# Patient Record
Sex: Female | Born: 1975 | Hispanic: Yes | Marital: Single | Smoking: Never smoker
Health system: Southern US, Community
[De-identification: ages and names within clinical notes are randomized; demographics above are authoritative.]

## PROBLEM LIST (undated history)

## (undated) ENCOUNTER — Inpatient Hospital Stay (HOSPITAL_COMMUNITY): Payer: Self-pay

## (undated) DIAGNOSIS — Z789 Other specified health status: Secondary | ICD-10-CM

## (undated) DIAGNOSIS — I83893 Varicose veins of bilateral lower extremities with other complications: Secondary | ICD-10-CM

## (undated) HISTORY — PX: NO PAST SURGERIES: SHX2092

---

## 2014-10-25 NOTE — L&D Delivery Note (Signed)
Delivery Note At 9:16 AM a viable female was delivered via  (Presentation: ;  ).  APGAR: , ; weight  .   Placenta status: , .  Cord:  with the following complications: .  Cord pH: not done  Anesthesia: Epidural  Episiotomy:   Lacerations:   Suture Repair: 2.0 Est. Blood Loss (mL):    Mom to postpartum.  Baby to Couplet care / Skin to Skin.  MARSHALL,BERNARD A 06/17/2015, 9:31 AM

## 2014-12-03 LAB — OB RESULTS CONSOLE GC/CHLAMYDIA
Chlamydia: NEGATIVE
Gonorrhea: NEGATIVE

## 2014-12-03 LAB — OB RESULTS CONSOLE RPR: RPR: NONREACTIVE

## 2014-12-03 LAB — CYTOLOGY - PAP: Pap: NEGATIVE

## 2014-12-03 LAB — OB RESULTS CONSOLE HEPATITIS B SURFACE ANTIGEN: HEP B S AG: NEGATIVE

## 2014-12-03 LAB — OB RESULTS CONSOLE GBS: GBS: NEGATIVE

## 2014-12-03 LAB — OB RESULTS CONSOLE RUBELLA ANTIBODY, IGM: RUBELLA: IMMUNE

## 2014-12-03 LAB — OB RESULTS CONSOLE HIV ANTIBODY (ROUTINE TESTING): HIV: NONREACTIVE

## 2014-12-05 ENCOUNTER — Encounter (HOSPITAL_COMMUNITY): Payer: Self-pay

## 2014-12-05 ENCOUNTER — Ambulatory Visit (HOSPITAL_COMMUNITY)
Admission: RE | Admit: 2014-12-05 | Discharge: 2014-12-05 | Disposition: A | Discharge status: Home / Self Care | Payer: Self-pay | Source: Ambulatory Visit | Attending: Obstetrics | Admitting: Obstetrics

## 2014-12-05 ENCOUNTER — Other Ambulatory Visit (HOSPITAL_COMMUNITY): Payer: Self-pay | Admitting: Obstetrics

## 2014-12-05 DIAGNOSIS — Z111 Encounter for screening for respiratory tuberculosis: Secondary | ICD-10-CM

## 2014-12-05 DIAGNOSIS — O9989 Other specified diseases and conditions complicating pregnancy, childbirth and the puerperium: Secondary | ICD-10-CM | POA: Insufficient documentation

## 2014-12-05 DIAGNOSIS — Z3A11 11 weeks gestation of pregnancy: Secondary | ICD-10-CM | POA: Insufficient documentation

## 2014-12-06 ENCOUNTER — Other Ambulatory Visit (HOSPITAL_COMMUNITY): Payer: Self-pay | Admitting: Obstetrics

## 2014-12-06 DIAGNOSIS — O26842 Uterine size-date discrepancy, second trimester: Secondary | ICD-10-CM

## 2014-12-06 DIAGNOSIS — O09522 Supervision of elderly multigravida, second trimester: Secondary | ICD-10-CM

## 2014-12-06 DIAGNOSIS — Z3689 Encounter for other specified antenatal screening: Secondary | ICD-10-CM

## 2014-12-10 ENCOUNTER — Ambulatory Visit (HOSPITAL_COMMUNITY)
Admission: RE | Admit: 2014-12-10 | Discharge: 2014-12-10 | Disposition: A | Discharge status: Home / Self Care | Payer: Self-pay | Source: Ambulatory Visit | Attending: Obstetrics | Admitting: Obstetrics

## 2014-12-10 ENCOUNTER — Encounter (HOSPITAL_COMMUNITY): Payer: Self-pay

## 2014-12-10 ENCOUNTER — Other Ambulatory Visit (HOSPITAL_COMMUNITY): Payer: Self-pay | Admitting: Obstetrics

## 2014-12-10 DIAGNOSIS — O26842 Uterine size-date discrepancy, second trimester: Secondary | ICD-10-CM

## 2014-12-10 DIAGNOSIS — O09522 Supervision of elderly multigravida, second trimester: Secondary | ICD-10-CM

## 2014-12-10 DIAGNOSIS — O09529 Supervision of elderly multigravida, unspecified trimester: Secondary | ICD-10-CM | POA: Insufficient documentation

## 2014-12-10 DIAGNOSIS — Z315 Encounter for genetic counseling: Secondary | ICD-10-CM | POA: Insufficient documentation

## 2014-12-10 DIAGNOSIS — O09521 Supervision of elderly multigravida, first trimester: Secondary | ICD-10-CM | POA: Insufficient documentation

## 2014-12-10 DIAGNOSIS — O26849 Uterine size-date discrepancy, unspecified trimester: Secondary | ICD-10-CM | POA: Insufficient documentation

## 2014-12-10 DIAGNOSIS — Z3689 Encounter for other specified antenatal screening: Secondary | ICD-10-CM

## 2014-12-10 DIAGNOSIS — Z3A12 12 weeks gestation of pregnancy: Secondary | ICD-10-CM | POA: Insufficient documentation

## 2014-12-10 NOTE — Progress Notes (Signed)
Genetic Counseling  High-Risk Gestation Note  Appointment Date:  12/10/2014 Referred By: Kathreen Cosier, MD Date of Birth:  Mar 19, 1976   Pregnancy History: [id number] Estimated Date of Delivery: 06/20/15 Estimated Gestational Age: [redacted]w[redacted]d Attending: Particia Nearing, MD  [name] was seen for genetic counseling because of a maternal age of 39 y.o.. She declined the use of medical interpreter. The patient's daughter, Roberta Marshall, assisted with Spanish/English interpretation.      She was counseled regarding maternal age and the association with risk for chromosome conditions due to nondisjunction with aging of the ova.   We reviewed chromosomes, nondisjunction, and the associated 1 in 24 risk for fetal aneuploidy related to a maternal age of 39 y.o. at [redacted]w[redacted]d gestation.  She was counseled that the risk for aneuploidy decreases as gestational age increases, accounting for those pregnancies which spontaneously abort.  We specifically discussed Down syndrome (trisomy 34), trisomies 36 and 24, and sex chromosome aneuploidies (47,XXX and 47,XXY) including the common features and prognoses of each.   We reviewed available screening options including First Screen, Quad screen, noninvasive prenatal screening (NIPS)/cell free DNA (cfDNA) testing, and detailed ultrasound.  She was counseled that screening tests are used to modify a patient's a priori risk for aneuploidy, typically based on age. This estimate provides a pregnancy specific risk assessment. We reviewed the benefits and limitations of each option. Specifically, we discussed the conditions for which each test screens, the detection rates, and false positive rates of each. She was also counseled regarding diagnostic testing via CVS and amniocentesis. We reviewed the approximate 1 in 100 risk for complications for CVS and the approximate 1 in 300-500 risk for complications for amniocentesis, including spontaneous pregnancy loss. After consideration  of all the options, she elected to proceed with ultrasound only at this time and declined all additional screening and testing options today. She stated that she needed additional time to read about and think about the available screening and testing options. She declined first trimester screening at this time.     Nuchal translucency ultrasound was performed today.  The report will be documented separately. The patient would like to return for a detailed ultrasound at ~18+ weeks gestation.  This appointment was scheduled for 01/21/15. She understands that screening tests cannot rule out all birth defects or genetic syndromes.  The patient was advised of this limitation and states she still does not want additional testing at this time.   We briefly reviewed the family histories, and the patient reported no known birth defects, intellectual disability, recurrent pregnancy loss, or known genetic conditions. Detailed family history intake was deferred due to time constraints and given that the patient was not initially scheduled for genetic counseling. Without further information regarding the provided family history, an accurate genetic risk cannot be calculated. Further genetic counseling is warranted if more information is obtained.  [name] denied exposure to environmental toxins or chemical agents. She denied the use of tobacco or street drugs. She reported drinking alcohol on New Year's.  Prenatal alcohol exposure can increase the risk for growth delays, small head size, heart defects, eye and facial differences, as well as behavior problems and learning disabilities. The risk of these to occur tends to increase with the amount of alcohol consumed. However, because there is no identified safe amount of alcohol in pregnancy, it is recommended to completely avoid alcohol in pregnancy. Given the reported amount of exposure, risk for associated effects are likely low in the current  pregnancy. She  denied significant viral illnesses during the course of her pregnancy. Her medical and surgical histories were noncontributory.   I counseled [name] regarding the above risks and available options.  The approximate face-to-face time with the genetic counselor was 40 minutes.  Roberta PlowmanKaren Hovanes Hymas, MS,  Certified Genetic Counselor 12/10/2014

## 2015-01-21 ENCOUNTER — Ambulatory Visit (HOSPITAL_COMMUNITY)
Admission: RE | Admit: 2015-01-21 | Discharge: 2015-01-21 | Disposition: A | Discharge status: Home / Self Care | Payer: Self-pay | Source: Ambulatory Visit | Attending: Obstetrics | Admitting: Obstetrics

## 2015-01-21 ENCOUNTER — Encounter (HOSPITAL_COMMUNITY): Payer: Self-pay

## 2015-01-21 DIAGNOSIS — O09529 Supervision of elderly multigravida, unspecified trimester: Secondary | ICD-10-CM | POA: Insufficient documentation

## 2015-01-21 DIAGNOSIS — Z3A18 18 weeks gestation of pregnancy: Secondary | ICD-10-CM | POA: Insufficient documentation

## 2015-01-21 DIAGNOSIS — Z3689 Encounter for other specified antenatal screening: Secondary | ICD-10-CM

## 2015-01-21 DIAGNOSIS — O26842 Uterine size-date discrepancy, second trimester: Secondary | ICD-10-CM

## 2015-01-21 DIAGNOSIS — O09522 Supervision of elderly multigravida, second trimester: Secondary | ICD-10-CM | POA: Insufficient documentation

## 2015-01-21 DIAGNOSIS — Z302 Encounter for sterilization: Secondary | ICD-10-CM | POA: Insufficient documentation

## 2015-01-21 DIAGNOSIS — Z36 Encounter for antenatal screening of mother: Secondary | ICD-10-CM | POA: Insufficient documentation

## 2015-05-20 LAB — OB RESULTS CONSOLE GBS: STREP GROUP B AG: NEGATIVE

## 2015-06-17 ENCOUNTER — Inpatient Hospital Stay (HOSPITAL_COMMUNITY)
Admission: AD | Admit: 2015-06-17 | Discharge: 2015-06-18 | DRG: 775 | Disposition: A | Discharge status: Home / Self Care | Payer: Medicaid Other | Source: Ambulatory Visit | Attending: Obstetrics | Admitting: Obstetrics

## 2015-06-17 ENCOUNTER — Inpatient Hospital Stay (HOSPITAL_COMMUNITY): Payer: Medicaid Other | Admitting: Anesthesiology

## 2015-06-17 ENCOUNTER — Encounter (HOSPITAL_COMMUNITY): Payer: Self-pay

## 2015-06-17 DIAGNOSIS — O26893 Other specified pregnancy related conditions, third trimester: Principal | ICD-10-CM | POA: Diagnosis present

## 2015-06-17 DIAGNOSIS — Z3A39 39 weeks gestation of pregnancy: Secondary | ICD-10-CM | POA: Diagnosis present

## 2015-06-17 DIAGNOSIS — O09523 Supervision of elderly multigravida, third trimester: Secondary | ICD-10-CM

## 2015-06-17 LAB — CBC
HCT: 36.9 % (ref 36.0–46.0)
Hemoglobin: 12.8 g/dL (ref 12.0–15.0)
MCH: 29.8 pg (ref 26.0–34.0)
MCHC: 34.7 g/dL (ref 30.0–36.0)
MCV: 86 fL (ref 78.0–100.0)
PLATELETS: 279 10*3/uL (ref 150–400)
RBC: 4.29 MIL/uL (ref 3.87–5.11)
RDW: 14.6 % (ref 11.5–15.5)
WBC: 8.5 10*3/uL (ref 4.0–10.5)

## 2015-06-17 LAB — TYPE AND SCREEN
ABO/RH(D): O POS
ANTIBODY SCREEN: NEGATIVE

## 2015-06-17 LAB — RPR: RPR Ser Ql: NONREACTIVE

## 2015-06-17 LAB — ABO/RH: ABO/RH(D): O POS

## 2015-06-17 MED ORDER — OXYTOCIN 40 UNITS IN LACTATED RINGERS INFUSION - SIMPLE MED
62.5000 mL/h | INTRAVENOUS | Status: DC
  Filled 2015-06-17: qty 1000

## 2015-06-17 MED ORDER — FENTANYL 2.5 MCG/ML BUPIVACAINE 1/10 % EPIDURAL INFUSION (WH - ANES)
14.0000 mL/h | INTRAMUSCULAR | Status: DC | PRN
  Administered 2015-06-17 (×2): 14 mL/h via EPIDURAL
  Filled 2015-06-17: qty 125

## 2015-06-17 MED ORDER — ZOLPIDEM TARTRATE 5 MG PO TABS: 5.0000 mg | ORAL_TABLET | Freq: Every evening | ORAL | Status: DC | PRN

## 2015-06-17 MED ORDER — BUTORPHANOL TARTRATE 1 MG/ML IJ SOLN: 1.0000 mg | INTRAMUSCULAR | Status: DC | PRN

## 2015-06-17 MED ORDER — PHENYLEPHRINE 40 MCG/ML (10ML) SYRINGE FOR IV PUSH (FOR BLOOD PRESSURE SUPPORT)
80.0000 ug | PREFILLED_SYRINGE | INTRAVENOUS | Status: DC | PRN
  Filled 2015-06-17: qty 2
  Filled 2015-06-17: qty 20

## 2015-06-17 MED ORDER — LACTATED RINGERS IV SOLN
INTRAVENOUS | Status: DC
  Administered 2015-06-17: 125 mL/h via INTRAVENOUS

## 2015-06-17 MED ORDER — ACETAMINOPHEN 325 MG PO TABS: 650.0000 mg | ORAL_TABLET | ORAL | Status: DC | PRN

## 2015-06-17 MED ORDER — OXYCODONE-ACETAMINOPHEN 5-325 MG PO TABS: 2.0000 | ORAL_TABLET | ORAL | Status: DC | PRN

## 2015-06-17 MED ORDER — LANOLIN HYDROUS EX OINT: TOPICAL_OINTMENT | CUTANEOUS | Status: DC | PRN

## 2015-06-17 MED ORDER — LIDOCAINE HCL (PF) 1 % IJ SOLN
30.0000 mL | INTRAMUSCULAR | Status: DC | PRN
  Filled 2015-06-17: qty 30

## 2015-06-17 MED ORDER — ONDANSETRON HCL 4 MG PO TABS: 4.0000 mg | ORAL_TABLET | ORAL | Status: DC | PRN

## 2015-06-17 MED ORDER — WITCH HAZEL-GLYCERIN EX PADS: 1.0000 "application " | MEDICATED_PAD | CUTANEOUS | Status: DC | PRN

## 2015-06-17 MED ORDER — OXYTOCIN BOLUS FROM INFUSION
500.0000 mL | INTRAVENOUS | Status: DC
  Administered 2015-06-17: 500 mL via INTRAVENOUS

## 2015-06-17 MED ORDER — METHYLERGONOVINE MALEATE 0.2 MG/ML IJ SOLN
INTRAMUSCULAR | Status: AC
  Administered 2015-06-17: 0.2 mg
  Filled 2015-06-17: qty 1

## 2015-06-17 MED ORDER — DIPHENHYDRAMINE HCL 50 MG/ML IJ SOLN: 12.5000 mg | INTRAMUSCULAR | Status: DC | PRN

## 2015-06-17 MED ORDER — METHYLERGONOVINE MALEATE 0.2 MG/ML IJ SOLN: 0.2000 mg | Freq: Once | INTRAMUSCULAR | Status: DC

## 2015-06-17 MED ORDER — IBUPROFEN 600 MG PO TABS
600.0000 mg | ORAL_TABLET | Freq: Four times a day (QID) | ORAL | Status: DC
  Administered 2015-06-17 – 2015-06-18 (×4): 600 mg via ORAL
  Filled 2015-06-17 (×3): qty 1

## 2015-06-17 MED ORDER — LACTATED RINGERS IV SOLN: 500.0000 mL | INTRAVENOUS | Status: DC | PRN

## 2015-06-17 MED ORDER — PRENATAL MULTIVITAMIN CH
1.0000 | ORAL_TABLET | Freq: Every day | ORAL | Status: DC
  Administered 2015-06-18: 1 via ORAL
  Filled 2015-06-17: qty 1

## 2015-06-17 MED ORDER — FERROUS SULFATE 325 (65 FE) MG PO TABS
325.0000 mg | ORAL_TABLET | Freq: Two times a day (BID) | ORAL | Status: DC
  Administered 2015-06-17 – 2015-06-18 (×2): 325 mg via ORAL
  Filled 2015-06-17 (×2): qty 1

## 2015-06-17 MED ORDER — DIBUCAINE 1 % RE OINT: 1.0000 "application " | TOPICAL_OINTMENT | RECTAL | Status: DC | PRN

## 2015-06-17 MED ORDER — FLEET ENEMA 7-19 GM/118ML RE ENEM: 1.0000 | ENEMA | RECTAL | Status: DC | PRN

## 2015-06-17 MED ORDER — BENZOCAINE-MENTHOL 20-0.5 % EX AERO: 1.0000 "application " | INHALATION_SPRAY | CUTANEOUS | Status: DC | PRN

## 2015-06-17 MED ORDER — ONDANSETRON HCL 4 MG/2ML IJ SOLN: 4.0000 mg | INTRAMUSCULAR | Status: DC | PRN

## 2015-06-17 MED ORDER — SENNOSIDES-DOCUSATE SODIUM 8.6-50 MG PO TABS
2.0000 | ORAL_TABLET | ORAL | Status: DC
  Administered 2015-06-18: 2 via ORAL
  Filled 2015-06-17: qty 2

## 2015-06-17 MED ORDER — CITRIC ACID-SODIUM CITRATE 334-500 MG/5ML PO SOLN: 30.0000 mL | ORAL | Status: DC | PRN

## 2015-06-17 MED ORDER — OXYCODONE-ACETAMINOPHEN 5-325 MG PO TABS: 1.0000 | ORAL_TABLET | ORAL | Status: DC | PRN

## 2015-06-17 MED ORDER — LIDOCAINE HCL (PF) 1 % IJ SOLN
INTRAMUSCULAR | Status: DC | PRN
  Administered 2015-06-17: 5 mL
  Administered 2015-06-17: 4 mL

## 2015-06-17 MED ORDER — EPHEDRINE 5 MG/ML INJ
10.0000 mg | INTRAVENOUS | Status: DC | PRN
  Filled 2015-06-17: qty 2

## 2015-06-17 MED ORDER — DIPHENHYDRAMINE HCL 25 MG PO CAPS: 25.0000 mg | ORAL_CAPSULE | Freq: Four times a day (QID) | ORAL | Status: DC | PRN

## 2015-06-17 MED ORDER — SIMETHICONE 80 MG PO CHEW: 80.0000 mg | CHEWABLE_TABLET | ORAL | Status: DC | PRN

## 2015-06-17 MED ORDER — ONDANSETRON HCL 4 MG/2ML IJ SOLN: 4.0000 mg | Freq: Four times a day (QID) | INTRAMUSCULAR | Status: DC | PRN

## 2015-06-17 MED ORDER — TETANUS-DIPHTH-ACELL PERTUSSIS 5-2.5-18.5 LF-MCG/0.5 IM SUSP
0.5000 mL | Freq: Once | INTRAMUSCULAR | Status: AC
  Administered 2015-06-17: 0.5 mL via INTRAMUSCULAR
  Filled 2015-06-17: qty 0.5

## 2015-06-17 NOTE — Anesthesia Preprocedure Evaluation (Signed)
Anesthesia Evaluation  Patient identified by MRN, date of birth, ID band Patient awake    Reviewed: Allergy & Precautions, NPO status , Patient's Chart, lab work & pertinent test results  History of Anesthesia Complications Negative for: history of anesthetic complications  Airway Mallampati: II  TM Distance: >3 FB Neck ROM: Full    Dental no notable dental hx. (+) Dental Advisory Given   Pulmonary neg pulmonary ROS,  breath sounds clear to auscultation  Pulmonary exam normal       Cardiovascular negative cardio ROS Normal cardiovascular examRhythm:Regular Rate:Normal     Neuro/Psych negative neurological ROS  negative psych ROS   GI/Hepatic negative GI ROS, Neg liver ROS,   Endo/Other  negative endocrine ROS  Renal/GU negative Renal ROS  negative genitourinary   Musculoskeletal negative musculoskeletal ROS (+)   Abdominal   Peds negative pediatric ROS (+)  Hematology negative hematology ROS (+)   Anesthesia Other Findings   Reproductive/Obstetrics (+) Pregnancy                             Anesthesia Physical Anesthesia Plan  ASA: II  Anesthesia Plan: Epidural   Post-op Pain Management:    Induction:   Airway Management Planned:   Additional Equipment:   Intra-op Plan:   Post-operative Plan: Extubation in OR  Informed Consent: I have reviewed the patients History and Physical, chart, labs and discussed the procedure including the risks, benefits and alternatives for the proposed anesthesia with the patient or authorized representative who has indicated his/her understanding and acceptance.   Dental advisory given  Plan Discussed with: CRNA  Anesthesia Plan Comments:         Anesthesia Quick Evaluation  

## 2015-06-17 NOTE — Progress Notes (Signed)
I stopped to check on patients room I ordered her meals, by Viria Alvarez Spanish Interpreter °

## 2015-06-17 NOTE — H&P (Signed)
Roberta Marshall is a 39 y.o. female presenting for UC's. Maternal Medical History:  Reason for admission: Contractions.   Contractions: Perceived severity is moderate.    Fetal activity: Perceived fetal activity is normal.   Last perceived fetal movement was within the past hour.    Prenatal complications: no prenatal complications Prenatal Complications - Diabetes: none.    OB History    Gravida Para Term Preterm AB TAB SAB Ectopic Multiple Living   History reviewed. No pertinent past medical history. History reviewed. No pertinent past surgical history. Family History: family history is not on file. Social History:  reports that she has never smoked. She does not have any smokeless tobacco history on file. She reports that she does not drink alcohol or use illicit drugs.   Prenatal Transfer Tool  Maternal Diabetes: No Genetic Screening: Declined Maternal Ultrasounds/Referrals: Normal Fetal Ultrasounds or other Referrals:  None Maternal Substance Abuse:  No Significant Maternal Medications:  None Significant Maternal Lab Results:  None Other Comments:  None  Review of Systems  All other systems reviewed and are negative.   Dilation: 4 Effacement (%): 90 Station: -1 Exam by:: D Simpson RN Blood pressure 128/69, pulse 67, temperature 98 F (36.7 C), temperature source Oral, resp. rate 18, height  (1.626 m), weight 204 lb (92.534 kg), last menstrual period 09/13/2014, SpO2 100 %. Maternal Exam:  Uterine Assessment: Contraction strength is moderate.  Abdomen: Patient reports no abdominal tenderness. Fetal presentation: vertex  Pelvis: adequate for delivery.   Cervix: Cervix evaluated by digital exam.     Physical Exam  Nursing note and vitals reviewed. Constitutional: She is oriented to person, place, and time. She appears well-developed and well-nourished.  HENT:  Head: Normocephalic and atraumatic.  Eyes: Conjunctivae are normal.  Pupils are equal, round, and reactive to light.  Neck: Normal range of motion. Neck supple.  Cardiovascular: Normal rate and regular rhythm.   Respiratory: Effort normal and breath sounds normal.  GI: Soft.  Genitourinary: Vagina normal and uterus normal.  Musculoskeletal: Normal range of motion.  Neurological: She is alert and oriented to person, place, and time.  Skin: Skin is warm and dry.  Psychiatric: She has a normal mood and affect. Her behavior is normal. Thought content normal.    Prenatal labs: ABO, Rh: --/--/O POS (08/23 0230) Antibody: NEG (08/23 0230) Rubella: Immune (02/09 0000) RPR: Nonreactive (02/09 0000)  HBsAg: Negative (02/09 0000)  HIV: Non-reactive (02/09 0000)  GBS: Negative (07/26 0000)   Assessment/Plan: 39.4 weeks.  Active labor.  Admit.   Roberta Marshall A 06/17/2015, 4:03 AM

## 2015-06-17 NOTE — Anesthesia Procedure Notes (Signed)
Epidural Patient location during procedure: OB  Staffing Anesthesiologist: Bee Marchiano Performed by: anesthesiologist   Preanesthetic Checklist Completed: patient identified, site marked, surgical consent, pre-op evaluation, timeout performed, IV checked, risks and benefits discussed and monitors and equipment checked  Epidural Patient position: sitting Prep: site prepped and draped and DuraPrep Patient monitoring: continuous pulse ox and blood pressure Approach: midline Location: L3-L4 Injection technique: LOR saline  Needle:  Needle type: Tuohy  Needle gauge: 17 G Needle length: 9 cm and 9 Needle insertion depth: 6 cm Catheter type: closed end flexible Catheter size: 19 Gauge Catheter at skin depth: 10 cm Test dose: negative  Assessment Events: blood not aspirated, injection not painful, no injection resistance, negative IV test and no paresthesia  Additional Notes Patient identified. Risks/Benefits/Options discussed with patient including but not limited to bleeding, infection, nerve damage, paralysis, failed block, incomplete pain control, headache, blood pressure changes, nausea, vomiting, reactions to medication both or allergic, itching and postpartum back pain. Confirmed with bedside nurse the patient's most recent platelet count. Confirmed with patient that they are not currently taking any anticoagulation, have any bleeding history or any family history of bleeding disorders. Patient expressed understanding and wished to proceed. All questions were answered. Sterile technique was used throughout the entire procedure. Please see nursing notes for vital signs. Test dose was given through epidural catheter and negative prior to continuing to dose epidural or start infusion. Warning signs of high block given to the patient including shortness of breath, tingling/numbness in hands, complete motor block, or any concerning symptoms with instructions to call for help. Patient was  given instructions on fall risk and not to get out of bed. All questions and concerns addressed with instructions to call with any issues or inadequate analgesia.   Reason for block:procedure for pain

## 2015-06-17 NOTE — MAU Note (Signed)
Pt reports contractions, denies bleeding or ROM.  

## 2015-06-17 NOTE — Progress Notes (Signed)
UR chart review completed.  

## 2015-06-17 NOTE — H&P (Signed)
This is Dr. Francoise Ceo dictating the history and physical on  Roberta Marshall  she's a 39 year old gravida 3 para 202 at 39 weeks and 4 days EDC 826 negative GBS in labor cervix 5 cm 100% vertex -2 station amniotomy performed the fluids clear and she is contracting every 2 minutes Past medical history negative Past surgical history negative Social history negative Family history negative System review noncontributory Physical exam well-developed female in labor HEENT negative Lungs clear to P&A Heart regular rhythm no murmurs no gallops Breasts negative Abdomen term Pelvic as described above Extremities negative

## 2015-06-18 LAB — CBC
HEMATOCRIT: 31.6 % — AB (ref 36.0–46.0)
HEMOGLOBIN: 10.8 g/dL — AB (ref 12.0–15.0)
MCH: 29.8 pg (ref 26.0–34.0)
MCHC: 34.2 g/dL (ref 30.0–36.0)
MCV: 87.1 fL (ref 78.0–100.0)
Platelets: 244 10*3/uL (ref 150–400)
RBC: 3.63 MIL/uL — ABNORMAL LOW (ref 3.87–5.11)
RDW: 14.9 % (ref 11.5–15.5)
WBC: 13.2 10*3/uL — ABNORMAL HIGH (ref 4.0–10.5)

## 2015-06-18 NOTE — Discharge Instructions (Signed)
Discharge instructions   You can wash your hair  Shower  Eat what you want  Drink what you want  See me in 6 weeks  Your ankles are going to swell more in the next 2 weeks than when pregnant  No sex for 6 weeks   Tyton Abdallah A, MD 06/18/2015

## 2015-06-18 NOTE — Discharge Summary (Signed)
Obstetric Discharge Summary Reason for Admission: onset of labor Prenatal Procedures: none Intrapartum Procedures: spontaneous vaginal delivery Postpartum Procedures: none Complications-Operative and Postpartum: none HEMOGLOBIN  Date Value Ref Range Status  06/18/2015 10.8* 12.0 - 15.0 g/dL Final   HCT  Date Value Ref Range Status  06/18/2015 31.6* 36.0 - 46.0 % Final    Physical Exam:  General: alert Lochia: appropriate Uterine Fundus: firm Incision: healing well DVT Evaluation: No evidence of DVT seen on physical exam.  Discharge Diagnoses: Term Pregnancy-delivered  Discharge Information: Date: 06/18/2015 Activity: pelvic rest Diet: routine Medications: Percocet Condition: stable Instructions: refer to practice specific booklet Discharge to: home Follow-up Information    Follow up with Kathreen Cosier, MD.   Specialty:  Obstetrics and Gynecology   Contact information:   [address] [place] [address] [phone]       Newborn Data: Live born female  Birth Weight: 7 lb 14.8 oz (3595 g) APGAR: 8, 9  Home with mother.  Nusaiba Guallpa A 06/18/2015, 7:11 AM

## 2015-06-18 NOTE — Anesthesia Postprocedure Evaluation (Signed)
  Anesthesia Post-op Note  Patient: Roberta Marshall  Procedure(s) Performed: * No procedures listed *  Patient Location: Mother/Baby  Anesthesia Type:Epidural  Level of Consciousness: awake, alert  and oriented  Airway and Oxygen Therapy: Patient Spontanous Breathing  Post-op Pain: none  Post-op Assessment: Post-op Vital signs reviewed and Patient's Cardiovascular Status Stable              Post-op Vital Signs: Reviewed and stable  Last Vitals:  Filed Vitals:   06/18/15 0558  BP: 120/56  Pulse: 65  Temp: 36.7 C  Resp: 20    Complications: No apparent anesthesia complications

## 2015-06-18 NOTE — Lactation Note (Signed)
This note was copied from the chart of Boy Nanci Lakatos. Lactation Consultation Note Mom has a 17 yr. Old and a 16 yr. Old which she BF for 6 months each. States this baby is BF good but hasn't pee peed and only 1 poop. Plans on breast and bottle. Has given 1 bottle of formula. Has good everted nipples. Hand expression taught w/noted colostrum. Speaks Spanish, good enough english to communicate well with me and answered question appropriate and asked me questions. Referred to Baby and Me Book in Breastfeeding section Pg. 22-23 for position options and Proper latch demonstration.Educated about newborn behavior, I&O, supply and demand. Mom encouraged to feed baby 8-12 times/24 hours and with feeding cues. Mom encouraged to do skin-to-skin.WH/LC brochure given w/resources, support groups and LC services. Patient Name: Boy Anandi Abramo ZOXWR'U Date: 06/18/2015 Reason for consult: Initial assessment   Maternal Data Has patient been taught Hand Expression?: Yes Does the patient have breastfeeding experience prior to this delivery?: Yes  Feeding Feeding Type: Bottle Fed - Formula Nipple Type: Slow - flow  LATCH Score/Interventions       Type of Nipple: Everted at rest and after stimulation  Comfort (Breast/Nipple): Soft / non-tender     Intervention(s): Breastfeeding basics reviewed;Support Pillows;Position options;Skin to skin     Lactation Tools Discussed/Used Tools: Bottle   Consult Status Consult Status: Follow-up Date: 06/19/15 Follow-up type: In-patient    Charyl Dancer 06/18/2015, 7:24 AM

## 2015-06-18 NOTE — Progress Notes (Signed)
Patient ID: Roberta Marshall, female   DOB: [birth year], 39 y.o.   MRN: [id number] Post partum day 1 Blood pressure 104/81 respiration 18 pulse 63 Fundus firm lochia moderate wants early discharge

## 2015-06-18 NOTE — Plan of Care (Signed)
Problem: Discharge Progression Outcomes Goal: Barriers To Progression Addressed/Resolved Outcome: Progressing Ltd. English- using interpreter     

## 2015-06-18 NOTE — Lactation Note (Signed)
This note was copied from the chart of Roberta Marshall. Lactation Consultation Note  Baby has been latching for brief periods.  She is supplementing with formula per choice.  DEBP set up and initiated.  Instructed mom to pump every 3 hours x 15 minutes to establish and maintain milk supply.  Milk coming to volume discussed.  Mom has a pump at home.  Encouraged to call prn.  Patient Name: Roberta Marshall [id number] Date: 06/18/2015     Maternal Data    Feeding    LATCH Score/Interventions                      Lactation Tools Discussed/Used     Consult Status      Huston Foley 06/18/2015, 3:02 PM

## 2016-12-04 ENCOUNTER — Encounter (HOSPITAL_COMMUNITY): Payer: Self-pay | Admitting: *Deleted

## 2016-12-04 ENCOUNTER — Inpatient Hospital Stay (HOSPITAL_COMMUNITY): Payer: Self-pay

## 2016-12-04 ENCOUNTER — Inpatient Hospital Stay (HOSPITAL_COMMUNITY)
Admission: AD | Admit: 2016-12-04 | Discharge: 2016-12-04 | Disposition: A | Discharge status: Home / Self Care | Payer: Medicaid Other | Source: Ambulatory Visit | Attending: Obstetrics & Gynecology | Admitting: Obstetrics & Gynecology

## 2016-12-04 DIAGNOSIS — O3091 Multiple gestation, unspecified, first trimester: Secondary | ICD-10-CM

## 2016-12-04 DIAGNOSIS — Z3A01 Less than 8 weeks gestation of pregnancy: Secondary | ICD-10-CM

## 2016-12-04 DIAGNOSIS — O30001 Twin pregnancy, unspecified number of placenta and unspecified number of amniotic sacs, first trimester: Secondary | ICD-10-CM

## 2016-12-04 DIAGNOSIS — O09511 Supervision of elderly primigravida, first trimester: Secondary | ICD-10-CM | POA: Insufficient documentation

## 2016-12-04 DIAGNOSIS — O208 Other hemorrhage in early pregnancy: Secondary | ICD-10-CM

## 2016-12-04 DIAGNOSIS — N939 Abnormal uterine and vaginal bleeding, unspecified: Secondary | ICD-10-CM

## 2016-12-04 DIAGNOSIS — O209 Hemorrhage in early pregnancy, unspecified: Secondary | ICD-10-CM | POA: Insufficient documentation

## 2016-12-04 DIAGNOSIS — Z3A08 8 weeks gestation of pregnancy: Secondary | ICD-10-CM | POA: Insufficient documentation

## 2016-12-04 DIAGNOSIS — O30041 Twin pregnancy, dichorionic/diamniotic, first trimester: Secondary | ICD-10-CM | POA: Insufficient documentation

## 2016-12-04 DIAGNOSIS — O26891 Other specified pregnancy related conditions, first trimester: Secondary | ICD-10-CM | POA: Insufficient documentation

## 2016-12-04 LAB — URINALYSIS, ROUTINE W REFLEX MICROSCOPIC
BACTERIA UA: NONE SEEN
Bilirubin Urine: NEGATIVE
Glucose, UA: NEGATIVE mg/dL
Ketones, ur: NEGATIVE mg/dL
Leukocytes, UA: NEGATIVE
Nitrite: NEGATIVE
PROTEIN: NEGATIVE mg/dL
Specific Gravity, Urine: 1.021 (ref 1.005–1.030)
pH: 5 (ref 5.0–8.0)

## 2016-12-04 LAB — CBC
HCT: 34 % — ABNORMAL LOW (ref 36.0–46.0)
HEMOGLOBIN: 11.9 g/dL — AB (ref 12.0–15.0)
MCH: 29 pg (ref 26.0–34.0)
MCHC: 35 g/dL (ref 30.0–36.0)
MCV: 82.7 fL (ref 78.0–100.0)
Platelets: 370 10*3/uL (ref 150–400)
RBC: 4.11 MIL/uL (ref 3.87–5.11)
RDW: 14.2 % (ref 11.5–15.5)
WBC: 8.1 10*3/uL (ref 4.0–10.5)

## 2016-12-04 LAB — WET PREP, GENITAL
Clue Cells Wet Prep HPF POC: NONE SEEN
Sperm: NONE SEEN
Trich, Wet Prep: NONE SEEN
Yeast Wet Prep HPF POC: NONE SEEN

## 2016-12-04 LAB — POCT PREGNANCY, URINE: Preg Test, Ur: POSITIVE — AB

## 2016-12-04 LAB — ABO/RH: ABO/RH(D): O POS

## 2016-12-04 LAB — HCG, QUANTITATIVE, PREGNANCY: hCG, Beta Chain, Quant, S: 241433 m[IU]/mL — ABNORMAL HIGH (ref <5)

## 2016-12-04 NOTE — MAU Provider Note (Signed)
Patient Roberta Marshall is a 41 year old [id number] at 7 weeks by LMP here with complaints of vaginal bleeding since yesterday morning. She last had intercourse on Thursday night, February 8. She denies suprapubic pain, but reports what she calls a "burning sensation" inside but she doesn't describe them as cramps. She denies pain with urination and pain with intercourse.   Interpreter Raquel present for all communication with patient.  History     CSN: [id number]  Arrival date and time: 12/04/16 1457   None     Chief Complaint  Patient presents with  . Vaginal Bleeding   Vaginal Bleeding  The patient's primary symptoms include vaginal bleeding. The patient's pertinent negatives include no genital itching, genital lesions, genital odor, genital rash, missed menses, pelvic pain or vaginal discharge. This is a new problem. The current episode started yesterday. The problem occurs intermittently. The problem has been gradually worsening. The patient is experiencing no pain. She is pregnant. Pertinent negatives include no abdominal pain, anorexia, back pain, chills, constipation, diarrhea, discolored urine, dysuria, fever, flank pain, frequency, headaches, hematuria, joint pain, joint swelling, nausea, painful intercourse, rash, sore throat, urgency or vomiting. Associated symptoms comments: She feels some "sensation" that is kind of stinging but not really painful. . The vaginal discharge was bloody. The vaginal bleeding is typical of menses. She has not been passing clots. She has not been passing tissue. Nothing aggravates the symptoms. There is no history of an abdominal surgery, a Cesarean section, an ectopic pregnancy, endometriosis, a gynecological surgery, herpes simplex, menorrhagia, metrorrhagia, miscarriage, ovarian cysts, perineal abscess, PID, an STD, a terminated pregnancy or vaginosis.    OB History    Gravida Para Term Preterm AB Living   4 3 3     1    SAB TAB Ectopic Multiple Live  Births         0 1      History reviewed. No pertinent past medical history.  History reviewed. No pertinent surgical history.  History reviewed. No pertinent family history.  Social History  Substance Use Topics  . Smoking status: Never Smoker  . Smokeless tobacco: Never Used  . Alcohol use No    Allergies: No Known Allergies  No prescriptions prior to admission.    Review of Systems  Constitutional: Negative for chills and fever.  HENT: Negative for sore throat.   Eyes: Negative.   Respiratory: Negative.   Cardiovascular: Negative.   Gastrointestinal: Negative for abdominal pain, anorexia, constipation, diarrhea, nausea and vomiting.  Endocrine: Negative.   Genitourinary: Positive for vaginal bleeding. Negative for dysuria, flank pain, frequency, hematuria, menorrhagia, missed menses, pelvic pain, urgency and vaginal discharge.  Musculoskeletal: Negative for back pain and joint pain.  Skin: Negative for rash.  Allergic/Immunologic: Negative.   Neurological: Negative for headaches.  Hematological: Negative.   Psychiatric/Behavioral: Negative for agitation.   Physical Exam   Blood pressure 123/67, pulse 84, temperature 98.3 F (36.8 C), resp. rate 18, last menstrual period 10/16/2016, unknown if currently breastfeeding.  Physical Exam  Constitutional: She is oriented to person, place, and time. She appears well-developed.  HENT:  Head: Normocephalic.  Neck: Normal range of motion.  Respiratory: Effort normal. No respiratory distress. She exhibits no tenderness.  GI: Soft. She exhibits no distension and no mass. There is no tenderness. There is no rebound and no guarding.  Genitourinary:  Genitourinary Comments: NEFG; vaginal walls pink with no discharge. Scant brown blood in the vaginal, no lesions on Cervix. No CMT. No suprapubic  tenderness or adnexal tenderness on exam.  Musculoskeletal: Normal range of motion.  Neurological: She is alert and oriented to  person, place, and time.  Skin: Skin is warm and dry.    MAU Course  Procedures  MDM -CBC-normal  - ABO: O positive - [id number] today -transvaginal US for ectopic rule out: US shows twin gestation (Di-Di) with FHR of 164 (Twin 1) and 161 (Twin 2) and small subchorionic hemorrhage.  -UA: negative for signs of dehydration or infection -GC and CT pending   Assessment and Plan   1. Vaginal bleeding   2. Vaginal bleeding    2. Reviewed with patient the twin gestation means she will need to be followed by the [facility]. Pt stable for discharge; reviewed when to return to MAU (increased bleeding, abdominal pain)  3. [facility] to Admin pool [facility]   Charlesetta GaribaldiKathryn Lorraine Kooistra CNM 12/04/2016, 4:34 PM

## 2016-12-04 NOTE — MAU Note (Signed)
Pt presents to MAU with complaints of vaginal spotting last night. Last intercourse on 12/02/16. Denies pain, reports sensation at the bottom of her abdomen. PT went to [facility] on 11/22/16 and was given an EDC of 07/23/17 by her last LMP

## 2016-12-04 NOTE — Discharge Instructions (Signed)
Embarazo mltiple (Multiple Pregnancy) Se habla de embarazo mltiple cuando la madre espera mellizos, trillizos o ms bebs. En la mayora de los casos, los embarazos mltiples son de mellizos. Es muy poco frecuente concebir de forma natural trillizos o ms cantidad de fetos. Los embarazos mltiples, en ocasiones, pueden ser ms riesgosos que los embarazos de un solo feto. Es ms probable que surjan algunos problemas. Por ejemplo, hay ms riesgo de sufrir hipertensin arterial durante el embarazo (preeclampsia). Es posible que necesite ms citas para los controles prenatales.  CAUSAS  A veces, el cuerpo libera ms de un vulo a la vez. El tipo ms frecuente de embarazo mltiple es cuando se produce la fertilizacin de ms de un vulo por ms de un espermatozoide. En este caso, se habla de gemelos dicigticos. Estos no son ms parecidos entre s que los otros hermanos. Los embarazos mltiples tambin tienen lugar cuando un espermatozoide fertiliza un vulo y luego ese vulo se divide en ms de un embrin. Estos son los casos de los gemelos idnticos o los trillizos. En este tipo de embarazos mltiples, los bebs son siempre del mismo sexo y muy parecidos. FACTORES DE RIESGO Puede correr ms riesgo de tener un embarazo mltiple si:  Tiene ms de 37aos.  Ya ha tenido cuatro hijos o ms.  Tiene antecedentes familiares de embarazos mltiples.  Le han hecho un tratamiento de fertilidad. SIGNOS Y SNTOMAS Entre los signos y sntomas de un embarazo mltiple, se incluyen los siguientes:  Aumento rpido de peso en los primeros 3meses de embarazo (primer trimestre).  tero ms grande de lo normal para la etapa del embarazo.  Nuseas ms intensas.  Un nivel de gonadotropina corinica humana (GCH) superior al normal. Esta es una hormona producida por el cuerpo al comienzo del embarazo. DIAGNSTICO  El mdico puede sospechar un embarazo mltiple debido a los sntomas y al hacerle un examen fsico.  Los resultados del anlisis de GCH en la sangre tambin pueden sugerir un embarazo mltiple. El mdico confirmar el embarazo mltiple a travs de una ecografa. Este tipo de examen incluye el uso de ondas de sonido y una computadora para obtener imgenes del interior del tero. La ecografa puede confirmar un embarazo mltiple despus de la semana6 a 8de embarazo. TRATAMIENTO  Si tiene un embarazo mltiple, puede necesitar lo siguiente:  Estudios iniciales para detectar posibles defectos de nacimiento o anomalas genticas.  Exmenes plvicos frecuentes para verificar signos de trabajo de parto prematuro.  Ecografas frecuentes durante el segundo trimestre.  Controles frecuentes de hipertensin arterial y de protenas en la orina. Estos son signos de preeclampsia.  Un antiinflamatorio (corticoide) si comienza el trabajo de parto prematuro. Este medicamento ayuda a que los pulmones de los fetos maduren.  Sulfato de magnesio para evitar la parlisis cerebral en los bebs si el parto est previsto antes de la semana32 de embarazo.  Cesrea si el parto vaginal es muy peligroso. INSTRUCCIONES PARA EL CUIDADO EN EL HOGAR  Debido a que el embarazo puede considerarse de alto riesgo, trabaje en estrecha colaboracin con el equipo de mdicos. Tambin es posible que deba hacer algunos cambios en el estilo de vida. Estos pueden incluir los siguientes:  Mejore la nutricin.  En los embarazos mltiples, se recomienda aumentar de 40 a 50lb (18 a 23kg). Intente aumentar 1lb (0,5kg) por semana durante las primeras 20semanas de embarazo.  El mdico le informar cuntas caloras debe agregar a la dieta cada da.  Coma bocadillos saludables varias veces durante el da. De   esta forma, puede agregar caloras y reducir las nuseas.  Beba suficiente lquido para mantener la orina clara o de color amarillo plido.  Tome las vitaminas prenatales.  Tome de 1500a 2000mg de calcio todos los das desde  la semana20 de embarazo hasta el parto.  Entre la semana 20 y 24, es posible que deba limitar sus actividades.  Evite hacer actividades intensas y trabajar.  Consulte al mdico cundo debe dejar de tener relaciones sexuales.  Descanse con frecuencia.  No fume.  No beba alcohol.  Haga arreglos para recibir ayuda adicional con las tareas de la casa.  Concurra a todas las visitas prenatales. SOLICITE ATENCIN MDICA SI:  Tiene mareos.  Siente calambres leves en la pelvis, presin en la pelvis o dolor persistente en la zona del abdomen o la parte baja de la espalda.  Tiene nuseas, vmitos o diarrea persistentes.  Observa una secrecin vaginal con mal olor.  Siente dolor al orinar.  Tiene dificultar para aumentar de peso.  Observa ms hinchazn en la cara, las manos, las piernas o los tobillos.  Tiene fiebre. SOLICITE ATENCIN MDICA DE INMEDIATO SI:   Tiene una prdida de lquido por la vagina.  Tiene sangrado o pequeas prdidas vaginales.  Siente dolor intenso o clicos en el abdomen.  Sube o baja de peso rpidamente.  Vomita sangre de color rojo brillante o una sustancia similar a los granos de caf.  Est expuesta a la rubola (sarampin alemn) y nunca la ha tenido.  Est expuesta a la quinta enfermedad o a la varicela.  Tiene un dolor de cabeza intenso.  Le falta el aire. Esta informacin no tiene como fin reemplazar el consejo del mdico. Asegrese de hacerle al mdico cualquier pregunta que tenga. Document Released: 08/01/2013 Document Revised: 02/02/2016 [employer] Interactive Patient Education  2017 [employer]  

## 2016-12-06 LAB — GC/CHLAMYDIA PROBE AMP (~~LOC~~) NOT AT ARMC
CHLAMYDIA, DNA PROBE: NEGATIVE
NEISSERIA GONORRHEA: NEGATIVE

## 2016-12-09 ENCOUNTER — Encounter: Payer: Self-pay | Admitting: Obstetrics and Gynecology

## 2016-12-09 ENCOUNTER — Ambulatory Visit (INDEPENDENT_AMBULATORY_CARE_PROVIDER_SITE_OTHER): Payer: Self-pay | Admitting: Obstetrics and Gynecology

## 2016-12-09 VITALS — BP 117/68 | HR 91 | Wt 178.6 lb

## 2016-12-09 DIAGNOSIS — O0990 Supervision of high risk pregnancy, unspecified, unspecified trimester: Secondary | ICD-10-CM | POA: Insufficient documentation

## 2016-12-09 DIAGNOSIS — O30041 Twin pregnancy, dichorionic/diamniotic, first trimester: Secondary | ICD-10-CM

## 2016-12-09 DIAGNOSIS — O30049 Twin pregnancy, dichorionic/diamniotic, unspecified trimester: Secondary | ICD-10-CM | POA: Insufficient documentation

## 2016-12-09 DIAGNOSIS — O09529 Supervision of elderly multigravida, unspecified trimester: Secondary | ICD-10-CM

## 2016-12-09 DIAGNOSIS — Z23 Encounter for immunization: Secondary | ICD-10-CM

## 2016-12-09 DIAGNOSIS — O09521 Supervision of elderly multigravida, first trimester: Secondary | ICD-10-CM

## 2016-12-09 DIAGNOSIS — Z113 Encounter for screening for infections with a predominantly sexual mode of transmission: Secondary | ICD-10-CM

## 2016-12-09 NOTE — Patient Instructions (Signed)
Primer trimestre de Psychiatrist (First Trimester of Pregnancy) El primer trimestre de Psychiatrist se extiende desde la semana1 hasta el final de la semana12 (mes1 al mes3). Una semana despus de que un espermatozoide fecunda un vulo, este se implantar en la pared uterina. Este embrin comenzar a Camera operator convertirse en un beb. Sus genes y los de su pareja forman el beb. Los genes del varn determinan si ser un nio o una nia. Entre la semana6 y [place], se forman los ojos y [place], y los latidos del corazn pueden verse en la ecografa. Al final de las 12semanas, todos los rganos del beb estn formados. Ahora que est embarazada, querr hacer todo lo que est a su alcance para tener un beb sano. Dos de las cosas ms importantes son Winferd Humphrey buena atencin prenatal y seguir las indicaciones del mdico. La atencin prenatal incluye toda la asistencia mdica que usted recibe antes del nacimiento del beb. Esta ayudar a prevenir, Engineer, manufacturing y tratar cualquier problema durante el embarazo y [place]. CAMBIOS EN EL ORGANISMO Su organismo atraviesa por muchos cambios durante el [place], y estos varan de Neomia Dear mujer a Educational psychologist.  Al principio, puede aumentar o bajar algunos kilos.  Puede tener Programme researcher, broadcasting/film/video (nuseas) y vomitar. Si no puede controlar los vmitos, llame al mdico.  Puede cansarse con facilidad.  Es posible que tenga dolores de cabeza que pueden aliviarse con los medicamentos que el mdico le permita tomar.  Puede orinar con mayor frecuencia. El dolor al orinar puede significar que usted tiene una infeccin de la vejiga.  Debido al Vanetta Mulders, puede tener acidez estomacal.  Puede estar estreida, ya que ciertas hormonas enlentecen los movimientos de los msculos que [employer] desechos a travs de los intestinos.  Pueden aparecer hemorroides o abultarse e hincharse las venas (venas varicosas).  Las [employer]ra Foods pueden empezar a Government social research officer y Emergency planning/management officer. Los pezones  pueden sobresalir ms, y el tejido que los rodea (areola) tornarse ms oscuro.  Las Veterinary surgeon y estar sensibles al cepillado y al hilo dental.  Pueden aparecer zonas oscuras o manchas (cloasma, mscara del Psychiatrist) en el rostro que probablemente se atenuarn despus del nacimiento del beb.  Los perodos menstruales se interrumpirn.  Tal vez no tenga apetito.  Puede sentir un fuerte deseo de consumir ciertos alimentos.  Puede tener cambios a Theatre manager a da, por ejemplo, por momentos puede estar emocionada por el Psychiatrist y por otros preocuparse porque algo pueda salir mal con el embarazo o el beb.  Tendr sueos ms vvidos y extraos.  Tal vez haya cambios en el cabello que pueden incluir su engrosamiento, crecimiento rpido y cambios en la textura. A algunas mujeres tambin se les cae el cabello durante o despus del [place], o tienen el cabello seco o fino. Lo ms probable es que el cabello se le normalice despus del nacimiento del beb. QU DEBE ESPERAR EN LAS CONSULTAS PRENATALES Durante una visita prenatal de rutina:  La pesarn para asegurarse de que usted y el beb estn creciendo normalmente.  Le controlarn la presin arterial.  Le medirn el abdomen para controlar el desarrollo del beb.  Se escucharn los latidos cardacos a partir de la semana10 o la12 de embarazo, aproximadamente.  Se analizarn los resultados de los estudios solicitados en visitas anteriores. El mdico puede preguntarle:  Cmo se siente.  Si siente los movimientos del beb.  Si ha tenido sntomas anormales, como prdida de lquido, [place], dolores de cabeza intensos o clicos  abdominales.  Si est consumiendo algn producto que contenga tabaco, como cigarrillos, tabaco de Theatre manager y Administrator, Civil Service.  Si tiene [employer]. Otros estudios que pueden realizarse durante el primer trimestre incluyen lo siguiente:  Anlisis de sangre para determinar el tipo  de sangre y Engineer, manufacturing la presencia de infecciones previas. Adems, se los usar para controlar si los niveles de hierro son bajos (anemia) y Chief Strategy Officer los anticuerpos Rh. En una etapa ms avanzada del [place], se harn anlisis de sangre para saber si tiene diabetes, junto con otros estudios si surgen problemas.  Anlisis de orina para detectar infecciones, diabetes o protenas en la orina.  Una ecografa para confirmar que el beb crece y se desarrolla correctamente.  Una amniocentesis para diagnosticar posibles problemas genticos.  Estudios del feto para descartar espina bfida y sndrome de [place].  Es posible que necesite otras pruebas adicionales.  Prueba del VIH (virus de inmunodeficiencia humana). Los exmenes prenatales de rutina incluyen la prueba de deteccin del VIH, a menos que decida no Futures trader. INSTRUCCIONES PARA EL CUIDADO EN EL HOGAR Medicamentos:   Siga las indicaciones del mdico en relacin con el uso de medicamentos. Durante el embarazo, hay medicamentos que pueden tomarse y otros que no.  Tome las vitaminas prenatales como se le indic.  Si est estreida, tome un laxante suave, si el mdico lo [place]. Dieta   Consuma alimentos balanceados. Elija alimentos variados, como carne o protenas de origen vegetal, pescado, leche y productos lcteos descremados, verduras, frutas y panes y Radiation protection practitioner. El mdico la ayudar a Production assistant, radio cantidad de peso que puede [place].  No coma carne cruda ni quesos sin cocinar. Estos elementos contienen bacterias que pueden causar defectos congnitos en el beb.  La ingesta diaria de cuatro o cinco comidas pequeas en lugar de tres comidas abundantes puede ayudar a Yahoo nuseas y los vmitos. Si empieza a tener nuseas, comer algunas [address] puede ser de [place]. Beber lquidos [place] comidas en lugar de tomarlos durante las comidas tambin puede ayudar a Optician, dispensing las nuseas y los vmitos.  Si est  estreida, consuma alimentos con alto contenido de [place], como verduras y frutas frescas, y Radiation protection practitioner. Beba suficiente lquido para Photographer orina clara o de color amarillo plido. Actividad y Programme researcher, broadcasting/film/video ejercicio solamente como se lo haya indicado el mdico. El ejercicio la ayudar a:  Art gallery manager.  Mantenerse en forma.  Estar preparada para el trabajo de parto y [place].  Los dolores, los clicos en la parte baja del abdomen o los calambres en la cintura son un buen indicio de que debe dejar de Corporate treasurer. Consulte al mdico antes de seguir haciendo ejercicios normales.  Intente no estar de pie FedEx. Mueva las piernas con frecuencia si debe estar de pie en un lugar durante mucho tiempo.  Evite levantar pesos [employer] Brands.  Use zapatos de tacones bajos y [place].  Puede seguir teniendo [employer] [place] Travelers, excepto que el mdico le indique lo contrario. Alivio del dolor o las molestias   Use un sostn que le brinde buen soporte si siente dolor a la palpacin Mattel.  Dese baos de asiento con agua tibia para Engineer, materials o las molestias causadas por las hemorroides. Use crema antihemorroidal si el mdico se lo permite.  Descanse con las piernas elevadas si tiene calambres o dolor de cintura.  Si tiene venas varicosas en las piernas, use medias de descanso. Eleve los pies  durante , 3 o 4veces por da. Limite la cantidad de sal en su dieta. Cuidados prenatales   Programe las visitas prenatales para la semana12 de [place]. Generalmente se programan cada mes al principio y se hacen ms frecuentes en los 2 ltimos meses antes del parto.  Escriba sus preguntas. Llvelas cuando concurra a las visitas prenatales.  Concurra a todas las visitas prenatales como se lo haya indicado el mdico. Seguridad   Colquese el cinturn de seguridad cuando conduzca.  Haga una lista de los nmeros de telfono de  Associate Professor, que [employer] nmeros de telfono de familiares, [place], el hospital y los departamentos de polica y bomberos. Consejos generales   Pdale al mdico que la derive a clases de educacin prenatal en su localidad. Debe comenzar a tomar las clases antes de Cytogeneticist en el mes6 de embarazo.  Pida ayuda si tiene necesidades nutricionales o de asesoramiento Academic librarian. El mdico puede aconsejarla o derivarla a especialistas para que la ayuden con diferentes necesidades.  No se d baos de inmersin en agua caliente, baos turcos ni saunas.  No se haga duchas vaginales ni use tampones o toallas higinicas perfumadas.  No mantenga las piernas cruzadas durante [place].  Evite el contacto con las bandejas sanitarias de los gatos y la tierra que estos animales usan. Estos elementos contienen bacterias que pueden causar defectos congnitos al beb y la posible prdida del feto debido a un aborto espontneo o muerte fetal.  No fume, no consuma hierbas ni medicamentos que no hayan sido recetados por el mdico. Las sustancias qumicas que estos productos contienen afectan la formacin y el desarrollo del beb.  No consuma ningn producto que contenga tabaco, lo que incluye cigarrillos, tabaco de Theatre manager y Administrator, Civil Service. Si necesita ayuda para dejar de fumar, consulte al American Express. Puede recibir asesoramiento y otro tipo de recursos para dejar de fumar.  Programe una cita con el dentista. En su casa, lvese los dientes con un cepillo dental blando y psese el hilo dental con suavidad. SOLICITE ATENCIN MDICA SI:  Tiene mareos.  Siente clicos leves, presin en la pelvis o dolor persistente en el abdomen.  Tiene nuseas, vmitos o diarrea persistentes.  Tiene secrecin vaginal con mal olor.  Siente dolor al ConocoPhillips.  Tiene el rostro, las [place], las piernas o los tobillos ms hinchados. SOLICITE ATENCIN MDICA DE INMEDIATO SI:  Tiene fiebre.  Tiene una prdida de  lquido por la vagina.  Tiene sangrado o pequeas prdidas vaginales.  Siente dolor intenso o clicos en el abdomen.  Sube o baja de peso rpidamente.  Vomita sangre de color rojo brillante o material que parezca granos de caf.  Ha estado expuesta a la rubola y no ha sufrido la enfermedad.  Ha estado expuesta a la quinta enfermedad o a la varicela.  Tiene un dolor de cabeza intenso.  Le falta el aire.  Sufre cualquier tipo de traumatismo, por ejemplo, debido a una cada o un accidente automovilstico. Esta informacin no tiene Theme park manager el consejo del mdico. Asegrese de hacerle al mdico cualquier pregunta que tenga. Document Released: 07/21/2005 Document Revised: 11/01/2014 Document Reviewed: 08/21/2013 Elsevier Interactive Patient Education  2017 Elsevier Inc.  [employer] Company del mtodo anticonceptivo (Contraception Choices) La anticoncepcin (control de la natalidad) es el uso de cualquier mtodo o dispositivo para Location manager. A continuacin se indican algunos de esos mtodos. MTODOS HORMONALES  El Implante contraconceptivo consiste en un tubo plstico delgado que contiene la hormona progesterona. No contiene estrgenos.  El mdico inserta el tubo en la parte interna del brazo. El tubo puede Geneticist, molecular durante 3 aos. Despus de los 3 aos debe retirarse. El implante impide que los ovarios liberen vulos (ovulacin), espesa el moco cervical, lo que evita que los espermatozoides ingresen al tero y hace ms delgada la membrana que cubre el interior del tero.  Inyecciones de progesterona sola: las Insurance underwriter cada 3 meses para Location manager. La progesterona sinttica impide que los ovarios liberen vulos. Tambin hacen que el moco cervical se espese y modifique el tejido de recubrimiento interno del tero. Esto hace ms difcil que los espermatozoides sobrevivan en el tero.  Las pldoras anticonceptivas contienen estrgenos y Education officer, museum.  Su funcin es [employer] ovarios liberen vulos (ovulacin). Las hormonas de los anticonceptivos orales hacen que el moco cervical se haga ms espeso, lo que evita que el esperma ingrese al tero. Las pldoras anticonceptivas son recetadas por el mdico.Tambin se utilizan para tratar los perodos menstruales abundantes.  Minipldora: este tipo de pldora anticonceptiva contiene slo hormona progesterona. Deben tomarse todos los [address] del mes y debe recetarlas el mdico.  El parche de control de natalidad: contiene hormonas similares a las que contienen las pldoras anticonceptivas. Deben cambiarse una vez por semana y se utilizan bajo prescripcin mdica.  Anillo vaginal: contiene hormonas similares a las que contienen las pldoras anticonceptivas. Se deja colocado durante tres semanas, se lo retira durante 1 semana y luego se coloca uno nuevo. La paciente debe sentirse cmoda al insertar y retirar el anillo de la vagina.Es necesaria la prescripcin mdica.  Anticonceptivos de emergencia: son mtodos para evitar un embarazo despus de Neomia Dear relacin sexual sin proteccin. Esta pldora puede tomarse inmediatamente despus de Child psychotherapist sexuales o hasta 5 [place] de haber tenido sexo sin proteccin. Es ms efectiva si se toma poco tiempo despus de la relacin sexual. Los anticonceptivos de emergencia estn disponibles sin prescripcin mdica. Consltelo con su farmacutico. No use los anticonceptivos de emergencia como nico mtodo anticonceptivo. MTODOS DE Lenis Noon  Condn masculino: es una vaina delgada (ltex o goma) que se coloca cubriendo al pene durante el acto sexual. Deri Fuelling con espermicida para aumentar la efectividad.  Condn femenino. Es una funda delicada y blanda que se adapta holgadamente a la vagina antes de las Clinical research associate.  Diafragma: es una barrera de ltex redonda y suave que debe ser recomendado por un profesional. Se inserta en la vagina, junto con un gel  espermicida. Debe insertarse antes de Management consultant. Debe dejar el diafragma colocado en la vagina durante 6 a 8 horas despus de la relacin sexual.  Capuchn cervical: es una barrera de ltex o taza plstica redonda y [place] que cubre el cuello del tero y debe ser colocada por un mdico. Puede dejarlo colocado en la vagina hasta 48 horas despus de las Clinical research associate.  Esponja: es una pieza blanda y circular de espuma de poliuretano. Contiene un espermicida. Se inserta en la vagina despus de mojarla y antes de las [employer].  Espermicidas: son sustancias qumicas que matan o bloquean al esperma y no lo dejan ingresar al cuello del tero y al tero. Vienen en forma de cremas, geles, supositorios, espuma o comprimidos. No es necesario tener Emergency planning/management officer. Se insertan en la vagina con un aplicador antes de Management consultant. El proceso debe repetirse cada vez que tiene relaciones sexuales. ANTICONCEPTIVOS INTRAUTERINOS  Dispositivo intrauterino (DIU) es un dispositivo en forma de T que se  coloca en el tero durante el perodo menstrual, para Location managerevitar el embarazo. Hay dos tipos:  DIU de cobre: este tipo de DIU est recubierto con un alambre de cobre y se inserta dentro del tero. El cobre hace que el tero y las trompas de [place] produzcan un liquido que Federated Department Storesdestruye los espermatozoides. Puede permanecer colocado durante 10 aos.  DIU con hormona: este tipo de DIU contiene la hormona progestina (progesterona sinttica). La hormona espesa el moco cervical y evita que los espermatozoides ingresen al tero y tambin afina la membrana que cubre el tero para evitar la implantacin del vulo fertilizado. La hormona debilita o destruye los espermatozoides que ingresan al tero. Puede Geneticist, molecularpermanecer en el lugar durante 3-5 aos, segn el tipo de DIU que se [place]utilice. MTODOS ANTICONCEPTIVOS PERMANENTES  Ligadura de trompas en la mujer: se realiza sellando, atando u obstruyendo  quirrgicamente las trompas de Falopio lo que impide que el vulo descienda hacia el tero.  Esterilizacin histeroscpica: Implica la colocacin de un pequeo espiral o la insercin en cada trompa de [place]io. El mdico utiliza una tcnica llamada histeroscopa para Primary school teacherrealizar este procedimiento. El dispositivo produce la formacin de tejido Designer, television/film setcicatrizal. Esto da como resultado una obstruccin permanente de las trompas de Falopio, de modo que la esperma no pueda fertilizar el vulo. Demora alrededor de 3 meses despus del procedimiento hasta que el conducto se obstruye. Tendr que usar otro mtodo anticonceptivo durante al menos 3 meses.  Esterilizacin masculina: se realiza ligando los conductos por los que pasan los espermatozoides (vasectoma).Esto impide que el esperma ingrese a la vagina durante el acto sexual. Luego del procedimiento, el hombre puede eyacular lquido (semen). MTODOS DE PLANIFICACIN NATURAL  Planificacin familiar natural: consiste en no Management consultanttener relaciones sexuales o usar un mtodo de barrera (condn, Ellstondiafragma, capuchn cervical) en los IKON Office Solutionsdas que la mujer podra quedar Freedomembarazada.  Mtodo de calendario: consiste en el seguimiento de la duracin de cada ciclo menstrual y la identificacin de los perodos frtiles.  Mtodo de ovulacin: Paramedicconsiste en evitar las relaciones sexuales durante la ovulacin.  Mtodo sintotrmico: Advertising copywriterconsiste en evitar las relaciones sexuales en la poca en la que se est ovulando, utilizando un termmetro y tendiendo en cuenta los sntomas de la ovulacin.  Mtodo postovulacin: Youth workerconsiste en planificar las relaciones sexuales para despus de haber ovulado. Independientemente del tipo o mtodo anticonceptivo que usted elija, es importante que use condones para protegerse contra las infecciones de transmisin sexual (ETS). Hable con su mdico con respecto a qu mtodo anticonceptivo es el ms apropiado para usted. Esta informacin no tiene Theme park managercomo fin reemplazar el  consejo del mdico. Asegrese de hacerle al mdico cualquier pregunta que tenga. Document Released: 10/11/2005 Document Revised: 06/13/2013 Document Reviewed: 04/05/2013 Elsevier Interactive Patient Education  2017 Elsevier Inc.   BentonLactancia materna (Breastfeeding) Decidir Museum/gallery exhibitions officeramamantar es una de las mejores elecciones que puede hacer por usted y su beb. El cambio hormonal durante el Psychiatristembarazo produce el desarrollo del tejido mamario y [place]umenta la cantidad y el tamao de los conductos galactforos. Estas hormonas tambin permiten que las protenas, los azcares y las grasas de la sangre produzcan la WPS Resourcesleche materna en las glndulas productoras de [place]le[place]. Las hormonas impiden que la leche materna sea liberada antes del nacimiento del beb, adems de impulsar el flujo de leche luego del nacimiento. Una vez que ha comenzado a Museum/gallery exhibitions officeramamantar, Conservation officer, naturepensar en el beb, as Immunologistcomo la succin o Theatre managerel llanto, pueden estimular la liberacin de [place] de las glndulas productoras de [place]. LOS BENEFICIOS DE AMAMANTAR Para  el beb   La primera leche (calostro) ayuda a Careers information officer funcionamiento del sistema digestivo del beb.  La leche tiene anticuerpos que ayudan a Radio producer las infecciones en el beb.  El beb tiene una menor incidencia de asma, alergias y del sndrome de muerte sbita del lactante.  Los nutrientes en la [place] materna son mejores para el beb que la [place] maternizada y estn preparados exclusivamente para cubrir las necesidades del beb.  La leche materna mejora el desarrollo cerebral del beb.  Es menos probable que el beb desarrolle otras enfermedades, como obesidad infantil, asma o diabetes mellitus de tipo 2. Para usted   La lactancia materna favorece el desarrollo de un vnculo muy especial entre la madre y el beb.  Es conveniente. La leche materna siempre est disponible a la Human resources officer y es [place].  La lactancia materna ayuda a quemar caloras y a perder el peso ganado durante el  [place].  Favorece la contraccin del tero al tamao que tena antes del embarazo de manera ms rpida y disminuye el sangrado (loquios) despus del parto.  La lactancia materna contribuye a reducir Nurse, adult de desarrollar diabetes mellitus de tipo 2, osteoporosis o cncer de mama o de ovario en el futuro. SIGNOS DE QUE EL BEB EST HAMBRIENTO Primeros signos de [address] de [place].  Se estira.  Mueve la cabeza de un lado a otro.  Mueve la cabeza y abre la boca cuando se le toca la mejilla o la comisura de la boca (reflejo de bsqueda).  Aumenta las vocalizaciones, tales como sonidos de succin, se relame los labios, emite arrullos, suspiros, o chirridos.  Mueve la [employer] boca.  Se chupa con ganas los dedos o las manos. Signos tardos de [employer].  Llora de manera intermitente. Signos de [employer] signos de hambre extrema requerirn que lo calme y lo consuele antes de que el beb pueda alimentarse adecuadamente. No espere a que se manifiesten los siguientes signos de hambre extrema para comenzar a Museum/gallery exhibitions officer:  Designer, jewellery.  Llanto intenso y fuerte.  Gritos. INFORMACIN BSICA SOBRE LA LACTANCIA MATERNA Iniciacin de la lactancia materna   Encuentre un lugar cmodo para sentarse o acostarse, con un buen respaldo para el cuello y la espalda.  Coloque una almohada o una manta enrollada debajo del beb para acomodarlo a la altura de la mama (si est sentada). Las almohadas para Museum/gallery exhibitions officer se han diseado especialmente a fin de servir de apoyo para los brazos y el beb [employer].  Asegrese de que el abdomen del beb est frente al suyo.  Masajee suavemente la mama. Con las yemas de los dedos, masajee la pared del pecho hacia el pezn en un movimiento circular. Esto estimula el flujo de [place]. Es posible que Engineer, manufacturing systems este movimiento mientras amamanta si la leche fluye lentamente.  Sostenga la mama con el  pulgar por arriba del pezn y los otros 4 dedos por debajo de la mama. Asegrese de que los dedos se encuentren lejos del pezn y de la boca del beb.  Empuje suavemente los labios del beb con el pezn o con el dedo.  Cuando la boca del beb se abra lo suficiente, acrquelo rpidamente a la mama e introduzca todo el pezn y la zona oscura que lo rodea (areola), tanto como sea posible, dentro de la boca del beb.  Debe haber ms areola visible por arriba del labio superior del beb que  por debajo del labio inferior.  La lengua del beb debe estar entre la enca inferior y la [place].  Asegrese de que la boca del beb est en la posicin correcta alrededor del pezn (prendida). Los labios del beb deben crear un sello sobre la mama y estar doblados hacia afuera (invertidos).  Es comn que el beb succione durante 2 a 3 minutos para que comience el flujo de [place]. Cmo debe prenderse  Es muy importante que le ensee al beb cmo prenderse adecuadamente a la mama. Si el beb no se prende adecuadamente, puede causarle dolor en el pezn y reducir la produccin de [place], y hacer que el beb tenga un escaso aumento de [place]. Adems, si el beb no se prende adecuadamente al pezn, puede tragar aire durante la alimentacin. Esto puede causarle molestias al beb. Hacer eructar al beb al [place] de mama puede ayudarlo a liberar el aire. Sin embargo, ensearle al beb cmo prenderse a la mama adecuadamente es la mejor manera de evitar que se sienta molesto por tragar Oceanographer se alimenta. Signos de que el beb se ha prendido adecuadamente al pezn:  Payton Doughty o succiona de modo silencioso, sin causarle dolor.  Se escucha que traga cada 3 o 4 succiones.  Hay movimientos musculares por arriba y por delante de sus odos al Printmaker. Signos de que el beb no se ha prendido Audiological scientist al pezn:  Hace ruidos de succin o de chasquido mientras se alimenta.  Siente dolor en el pezn. Si  cree que el beb no se prendi correctamente, deslice el dedo en la comisura de la boca y [employer] las encas del beb para interrumpir la succin. Intente comenzar a amamantar nuevamente. Signos de Fish farm manager  Signos del beb:  Disminuye gradualmente el nmero de succiones o cesa la succin por completo.  Se duerme.  Relaja el cuerpo.  Retiene una pequea cantidad de [facility] boca.  Se desprende solo del pecho. Signos que presenta usted:  Las mamas han aumentado la firmeza, el peso y el tamao 1 a 3 horas despus de Museum/gallery exhibitions officer.  Estn ms blandas inmediatamente despus de amamantar.  Un aumento del volumen de [place], y tambin un cambio en su consistencia y color se producen hacia el quinto da de Tour manager.  Los pezones no duelen, ni estn agrietados ni sangran. Signos de que su beb recibe la cantidad de leche suficiente   Mojar por lo menos 1 o 2 paales durante las primeras 24 horas despus del nacimiento.  Mojar por lo menos 5 o 6 paales cada 24 horas durante la primera semana despus del nacimiento. La orina debe ser transparente o de color amarillo plido a los 5 das despus del nacimiento.  Mojar entre 6 y 8 paales cada 24 horas a medida que el beb sigue creciendo y desarrollndose.  Defeca al menos 3 veces en 24 horas a los 5 [address]  [address] de [address] La materia fecal debe ser blanda y [place].  Defeca al menos 3 veces en 24 horas a los [address] de [address] La materia fecal debe ser grumosa y [place].  No registra una prdida de peso mayor del 10% del peso al nacer durante los primeros 3 [address]  [address] de [place].  Aumenta de peso un promedio de 4 a 7onzas (113 a 198g) por semana despus de los 4 [address]  [address] de vida.  Aumenta de [place], [place], de Horace uniforme a Glass blower/designer de los 5 [address]  [address] de vida, sin Passenger transport manager prdida de peso despus  de las 2semanas de vida. Despus de alimentarse, es posible que el beb regurgite una pequea cantidad. Esto es  frecuente. FRECUENCIA Y DURACIN DE LA LACTANCIA MATERNA El amamantamiento frecuente la ayudar a producir ms [place] y a Education officer, community de Engineer, mining en los pezones e hinchazn en las [place]. Alimente al beb cuando muestre signos de hambre o si siente la necesidad de reducir la congestin de las [place]. Esto se denomina "lactancia a demanda". Evite el uso del chupete mientras trabaja para establecer la lactancia (las primeras 4 a 6 semanas despus del nacimiento del beb). Despus de este perodo, podr ofrecerle un chupete. Las investigaciones demostraron que el uso del chupete durante el primer ao de vida del beb disminuye el riesgo de desarrollar el sndrome de muerte sbita del lactante (SMSL). Permita que el nio se alimente en cada mama todo lo que desee. Contine amamantando al beb hasta que haya terminado de alimentarse. Cuando el beb se desprende o se queda dormido mientras se est alimentando de la primera mama, ofrzcale la segunda. Debido a que, con frecuencia, los recin [place] las primeras semanas de vida, es posible que deba despertar al beb para alimentarlo. Los horarios de Acupuncturist de un beb a otro. Sin embargo, las siguientes reglas pueden servir como gua para ayudarla a Lawyer que el beb se alimenta adecuadamente:  Se puede amamantar a los recin nacidos (bebs de 4 semanas o menos de vida) cada 1 a 3 horas.  No deben transcurrir ms de 3 horas durante el da o 5 horas durante la noche sin que se amamante a los recin nacidos.  Debe amamantar al beb 8 veces como mnimo en un perodo de 24 horas, hasta que comience a introducir slidos en su dieta, a los 6 meses de vida aproximadamente. EXTRACCIN DE [employer] MATERNA La extraccin y Contractor de la leche materna le permiten asegurarse de que el beb se alimente exclusivamente de Stanley, aun en momentos en los que no puede amamantar. Esto tiene especial importancia si debe regresar  al Aleen Campi en el perodo en que an est amamantando o si no puede estar presente en los momentos en que el beb debe alimentarse. Su asesor en lactancia puede orientarla sobre cunto tiempo es seguro almacenar [place]. El sacaleche es un aparato que le permite extraer leche de la mama a un recipiente estril. Luego, la leche materna extrada puede almacenarse en un refrigerador o Electrical engineer. Algunos sacaleches son Birdie Riddle, Delaney Meigs otros son elctricos. Consulte a su asesor en lactancia qu tipo ser ms conveniente para usted. Los sacaleches se pueden comprar; sin embargo, algunos hospitales y grupos de apoyo a la lactancia materna alquilan Sports coach. Un asesor en lactancia puede ensearle cmo extraer [employer], en caso de que prefiera no usar un sacaleche. CMO CUIDAR LAS MAMAS DURANTE LA LACTANCIA MATERNA Los pezones se secan, agrietan y duelen durante la Tour manager. Las siguientes recomendaciones pueden ayudarla a Pharmacologist las [employer] Partners y sanas:  Careers information officer usar jabn en los pezones.  Use un sostn de soporte. Aunque no son esenciales, las camisetas sin mangas o los sostenes especiales para Museum/gallery exhibitions officer estn diseados para acceder fcilmente a las mamas, para Museum/gallery exhibitions officer sin tener que quitarse todo el sostn o la camiseta. Evite usar sostenes con aro o sostenes muy ajustados.  Seque al aire sus pezones durante 3 a despus de amamantar al beb.  Utilice solo apsitos de Haematologist sostn para Environmental health practitioner las prdidas de [place]. La  prdida de un poco de [employer] tomas es normal.  Utilice lanolina sobre los pezones luego de Museum/gallery exhibitions officer. La lanolina ayuda a mantener la humedad normal de la piel. Si [place] lanolina pura, no tiene que lavarse los pezones antes de volver a Corporate treasurer al beb. La lanolina pura no es txica para el beb. Adems, puede extraer [employer] algunas gotas de [place] materna y Engineer, maintenance (IT) suavemente esa [employer], para que la [place] se seque al aire. Durante las primeras semanas despus de dar a luz, algunas mujeres pueden experimentar hinchazn en las mamas (congestin [place]). La congestin puede hacer que sienta las mamas pesadas, calientes y sensibles al tacto. El pico de la congestin ocurre dentro de los 3 a 5 das despus del [place]. Las siguientes recomendaciones pueden ayudarla a Paramedic la congestin:  Vace por completo las mamas al [employer] o Environmental health practitioner. Puede aplicar calor hmedo en las mamas (en la ducha o con toallas hmedas para manos) antes de Museum/gallery exhibitions officer o extraer [employer]. Esto aumenta la circulacin y [place] a que la [place]. Si el beb no vaca por completo las [address] cuando lo [address], extraiga la [employer] restante despus de que haya finalizado.  Use un sostn ajustado (para amamantar o comn) o una camiseta sin mangas durante 1 o 2 das para indicar al cuerpo que disminuya ligeramente la produccin de [place].  Aplique compresas de hielo Yahoo[employer] Inc, a menos que le resulte demasiado incmodo.  Asegrese de que el beb est prendido y se encuentre en la posicin correcta mientras lo alimenta. Si la congestin persiste luego de 48 horas o despus de seguir estas recomendaciones, comunquese con su mdico o un Holiday representative. RECOMENDACIONES GENERALES PARA EL CUIDADO DE LA SALUD DURANTE LA LACTANCIA MATERNA  Consuma alimentos saludables. Alterne comidas y colaciones, y coma 3 de cada una por da. Dado que lo que come [employer], es posible que algunas comidas hagan que su beb se vuelva ms irritable de lo habitual. Evite comer este tipo de alimentos si percibe que afectan de manera negativa al beb.  Beba leche, jugos de fruta y agua para Patent examiner su sed (aproximadamente 10 vasos al Futures trader).  Descanse con frecuencia, reljese y tome sus vitaminas prenatales para evitar la fatiga, el estrs y la anemia.  Contine con los autocontroles de la mama.  Evite  Product manager y fumar tabaco. Las sustancias qumicas de los cigarrillos que pasan a la leche materna y la exposicin al humo ambiental del tabaco pueden daar al beb.  No consuma alcohol ni drogas, incluida la marihuana. Algunos medicamentos, que pueden ser perjudiciales para el beb, pueden pasar a travs de la [employer]. Es importante que consulte a su mdico antes de Medical sales representative, incluidos todos los medicamentos recetados y de [place], as como los suplementos vitamnicos y herbales. Puede quedar embarazada durante la lactancia. Si desea controlar la natalidad, consulte a su mdico cules son las opciones ms seguras para el beb. SOLICITE ATENCIN MDICA SI:  Usted siente que quiere dejar de Museum/gallery exhibitions officer o se siente frustrada con la lactancia.  Siente dolor en las mamas o en los pezones.  Sus pezones estn agrietados o Water quality scientist.  Sus pechos estn irritados, sensibles o calientes.  Tiene un rea hinchada en cualquiera de las mamas.  Siente escalofros o fiebre.  Tiene nuseas o vmitos.  Presenta una secrecin de otro lquido distinto de la leche materna de los pezones.  Sus mamas no se llenan antes  de amamantar al beb para el quinto da despus del [place].  Se siente triste y deprimida.  El beb est demasiado somnoliento como para comer bien.  El beb tiene problemas para dormir.  Moja menos de 3 paales en 24 horas.  Defeca menos de 3 veces en 24 horas.  La piel del beb o la parte blanca de los ojos se vuelven amarillentas.  El beb no ha aumentado de [place] a los [address] de [place]. SOLICITE ATENCIN MDICA DE INMEDIATO SI:  El beb est muy cansado Retail buyer) y no se quiere despertar para comer.  Le sube la fiebre sin causa. Esta informacin no tiene Theme park manager el consejo del mdico. Asegrese de hacerle al mdico cualquier pregunta que tenga. Document Released: 10/11/2005 Document Revised: 02/02/2016 Document Reviewed: 04/04/2013 Elsevier  Interactive Patient Education  2017 [employer].

## 2016-12-09 NOTE — Progress Notes (Signed)
  Subjective:    Roberta Marshall is a [id number] 1737w5d being seen today for her first obstetrical visit.  Her obstetrical history is significant for advanced maternal age. Patient does intend to breast feed. Pregnancy history fully reviewed.  Patient reports nausea well managed without medications  Vitals:   12/09/16 0812  BP: 117/68  Pulse: 91  Weight: 178 lb 9.6 oz (81 kg)    HISTORY: OB History  Gravida Para Term Preterm AB Living  4 3 3     3   SAB TAB Ectopic Multiple Live Births        0 3    # Outcome Date GA Lbr Len/2nd Weight Sex Delivery Anes PTL Lv  4 Current           3 Term 06/17/15 7142w4d 16:00 / 00:16 7 lb 14.8 oz (3.595 kg) M Vag-Spont EPI  LIV     Birth Comments: WNL  2 Term 05/18/99 5280w0d  8 lb (3.629 kg) F Vag-Spont None N LIV  1 Term 07/22/98 849w0d   M Vag-Spont None N LIV     History reviewed. No pertinent past medical history. History reviewed. No pertinent surgical history. History reviewed. No pertinent family history.   Exam    Uterus:   10-week size  Pelvic Exam:    Perineum: No Hemorrhoids, Normal Perineum   Vulva: normal   Vagina:  normal mucosa, normal discharge   pH:    Cervix: multiparous appearance and closed and long   Adnexa: normal adnexa and no mass, fullness, tenderness   Bony Pelvis: gynecoid  System: Breast:  normal appearance, no masses or tenderness   Skin: normal coloration and turgor, no rashes    Neurologic: oriented, no focal deficits   Extremities: normal strength, tone, and muscle mass   HEENT extra ocular movement intact   Mouth/Teeth mucous membranes moist, pharynx normal without lesions and dental hygiene good   Neck supple and no masses   Cardiovascular: regular rate and rhythm   Respiratory:  chest clear, no wheezing, crepitations, rhonchi, normal symmetric air entry   Abdomen: soft, non-tender; bowel sounds normal; no masses,  no organomegaly   Urinary:       Assessment:    Pregnancy: [id number] Patient  Active Problem List   Diagnosis Date Noted  . Dichorionic diamniotic twin pregnancy, antepartum 12/09/2016  . Supervision of high risk pregnancy, antepartum 12/09/2016  . Maternal age 89+, multigravida, antepartum         Plan:     Initial labs drawn. Prenatal vitamins. Problem list reviewed and updated. Genetic Screening discussed First Screen: ordered.  Ultrasound discussed; fetal survey: requested. Patient agreed to flu vaccine Reviewed risk of preterm birth associated with multiple pregnancy Patient interested in BTL for contraception- advised patient to meet with Barbee Cougheina financial coordinator  Follow up in 4 weeks. 50% of 30 min visit spent on counseling and coordination of care.     [name] 12/09/2016

## 2016-12-09 NOTE — Progress Notes (Signed)
Spanish Interpreter Raquel Carrolyn MeiersMora  New ob packet given

## 2016-12-09 NOTE — Progress Notes (Signed)
Pt reports varicose veins on BLE First Trimester Screen/Genetic Counseling scheduled March 23rd @ 1000.  Pt notified.

## 2016-12-10 LAB — HEMOGLOBIN A1C
Hgb A1c MFr Bld: 4.9 % (ref <5.7)
MEAN PLASMA GLUCOSE: 94 mg/dL

## 2016-12-10 LAB — PRENATAL PROFILE (SOLSTAS)
ANTIBODY SCREEN: NEGATIVE
BASOS PCT: 0 %
Basophils Absolute: 0 cells/uL (ref 0–200)
Eosinophils Absolute: 66 cells/uL (ref 15–500)
Eosinophils Relative: 1 %
HEMATOCRIT: 38.2 % (ref 35.0–45.0)
HIV: NONREACTIVE
Hemoglobin: 12.5 g/dL (ref 11.7–15.5)
Hepatitis B Surface Ag: NEGATIVE
LYMPHS PCT: 28 %
Lymphs Abs: 1848 cells/uL (ref 850–3900)
MCH: 28.5 pg (ref 27.0–33.0)
MCHC: 32.7 g/dL (ref 32.0–36.0)
MCV: 87 fL (ref 80.0–100.0)
MONO ABS: 528 {cells}/uL (ref 200–950)
MPV: 9.9 fL (ref 7.5–12.5)
Monocytes Relative: 8 %
NEUTROS PCT: 63 %
Neutro Abs: 4158 cells/uL (ref 1500–7800)
PLATELETS: 414 10*3/uL — AB (ref 140–400)
RBC: 4.39 MIL/uL (ref 3.80–5.10)
RDW: 13.9 % (ref 11.0–15.0)
RH TYPE: POSITIVE
Rubella: <0.9 Index (ref <0.90)
WBC: 6.6 10*3/uL (ref 3.8–10.8)

## 2016-12-10 LAB — GC/CHLAMYDIA PROBE AMP (~~LOC~~) NOT AT ARMC
Chlamydia: NEGATIVE
Neisseria Gonorrhea: NEGATIVE

## 2016-12-11 ENCOUNTER — Encounter: Payer: Self-pay | Admitting: Obstetrics and Gynecology

## 2016-12-11 DIAGNOSIS — Z283 Underimmunization status: Secondary | ICD-10-CM | POA: Insufficient documentation

## 2016-12-11 DIAGNOSIS — O9989 Other specified diseases and conditions complicating pregnancy, childbirth and the puerperium: Secondary | ICD-10-CM

## 2016-12-11 DIAGNOSIS — O09899 Supervision of other high risk pregnancies, unspecified trimester: Secondary | ICD-10-CM | POA: Insufficient documentation

## 2017-01-06 ENCOUNTER — Ambulatory Visit (INDEPENDENT_AMBULATORY_CARE_PROVIDER_SITE_OTHER): Payer: Self-pay | Admitting: Obstetrics & Gynecology

## 2017-01-06 ENCOUNTER — Encounter: Payer: Self-pay | Admitting: Obstetrics & Gynecology

## 2017-01-06 VITALS — BP 104/43 | HR 74 | Wt 182.0 lb

## 2017-01-06 DIAGNOSIS — O30049 Twin pregnancy, dichorionic/diamniotic, unspecified trimester: Secondary | ICD-10-CM

## 2017-01-06 DIAGNOSIS — O9989 Other specified diseases and conditions complicating pregnancy, childbirth and the puerperium: Secondary | ICD-10-CM

## 2017-01-06 DIAGNOSIS — O09899 Supervision of other high risk pregnancies, unspecified trimester: Secondary | ICD-10-CM

## 2017-01-06 DIAGNOSIS — O09521 Supervision of elderly multigravida, first trimester: Secondary | ICD-10-CM

## 2017-01-06 DIAGNOSIS — Z283 Underimmunization status: Secondary | ICD-10-CM

## 2017-01-06 DIAGNOSIS — O09529 Supervision of elderly multigravida, unspecified trimester: Secondary | ICD-10-CM

## 2017-01-06 DIAGNOSIS — O30041 Twin pregnancy, dichorionic/diamniotic, first trimester: Secondary | ICD-10-CM

## 2017-01-06 DIAGNOSIS — O0990 Supervision of high risk pregnancy, unspecified, unspecified trimester: Secondary | ICD-10-CM

## 2017-01-06 NOTE — Progress Notes (Signed)
   PRENATAL VISIT NOTE  Subjective:  Roberta Marshall is a 41 y.o. [phone] at 5030w5d being seen today for ongoing prenatal care.  She is currently monitored for the following issues for this high-risk pregnancy and has Maternal age 39+, multigravida, antepartum; Dichorionic diamniotic twin pregnancy, antepartum; Supervision of high risk pregnancy, antepartum; and Rubella non-immune status, antepartum on her problem list.  Patient reports nausea and vomiting.  Contractions: Not present. Vag. Bleeding: None.  Movement: Absent. Denies leaking of fluid.   The following portions of the patient's history were reviewed and updated as appropriate: allergies, current medications, past family history, past medical history, past social history, past surgical history and problem list. Problem list updated.  Objective:   Vitals:   01/06/17 0753  BP: (!) 104/43  Pulse: 74  Weight: 182 lb (82.6 kg)    Fetal Status: Fetal Heart Rate (bpm): 150/140   Movement: Absent     General:  Alert, oriented and cooperative. Patient is in no acute distress.  Skin: Skin is warm and dry. No rash noted.   Cardiovascular: Normal heart rate noted  Respiratory: Normal respiratory effort, no problems with respiration noted  Abdomen: Soft, gravid, appropriate for gestational age. Pain/Pressure: Present     Pelvic:  Cervical exam deferred        Extremities: Normal range of motion.  Edema: None  Mental Status: Normal mood and affect. Normal behavior. Normal judgment and thought content.   Assessment and Plan:  Pregnancy: [id number] at 2230w5d  1. Maternal age 41+, multigravida, antepartum Genetics on March 23rd One hour GCT at next visit.  Given her age and twins and ethnicity, a more sensitive screening would be good.    2. Dichorionic diamniotic twin pregnancy, antepartum No issues currently.  FH x2 by RN  3. Rubella non-immune status, antepartum Vaccinate pp  4.  Nausea Mild in am and then resolves.  Preterm  labor symptoms and general obstetric precautions including but not limited to vaginal bleeding, contractions, leaking of fluid and fetal movement were reviewed in detail with the patient. Please refer to After Visit Summary for other counseling recommendations.  Return in about 4 weeks (around 02/03/2017).   Lesly DukesKelly H Nia Nathaniel, MD

## 2017-01-06 NOTE — Patient Instructions (Signed)
Informacin sobre el dispositivo intrauterino (Intrauterine Device Information) Un dispositivo intrauterino (DIU) se inserta en el tero e impide el embarazo. Hay dos tipos de DIU:  DIU de cobre: este tipo de DIU est recubierto con un alambre de cobre y se inserta dentro del tero. El cobre hace que el tero y las trompas de [place] produzcan un liquido que [employer] espermatozoides. El DIU de cobre puede Geneticist, molecular durante 10 aos.  DIU con hormona: este tipo de DIU contiene la hormona progestina (progesterona sinttica). Las hormonas hacen que el moco cervical se haga ms espeso, lo que evita que el esperma ingrese al tero. Tambin hace que la membrana que recubre internamente al tero sea ms delgada lo que impide el implante del vulo fertilizado. La hormona debilita o destruye los espermatozoides que ingresan al tero. Alguno de los tipos de DIU hormonal pueden Geneticist, molecular durante 5 aos y otros tipos pueden dejarse en el lugar por 3 aos. El mdico se asegurar de que usted sea una buena candidata para usar el DIU. Converse con su mdico acerca de los posibles efectos secundarios. VENTAJASDEL DISPOSITIVO INTRAUTERINO  El DIU es muy eficaz, reversible, de accin prolongada y de bajo mantenimiento.  No hay efectos secundarios relacionados con el estrgeno.  El DIU puede ser utilizado durante la Market researcher.  No est asociado con el aumento de [place].  Funciona inmediatamente despus de la insercin.  El DIU hormonal funciona inmediatamente si se inserta dentro de los [address] del inicio del perodo. Ser necesario que utilice un mtodo anticonceptivo adicional durante 7 das si el DIU hormonal se inserta en algn otro momento del ciclo.  El DIU de cobre no interfiere con las hormonas femeninas.  El DIU hormonal puede hacer que los perodos menstruales abundantes se hagan ms ligeros y que haya menos clicos.  El DIU hormonal puede usarse durante 3 a 5 aos.  El  DIU de cobre puede usarse durante 10 aos. DESVENTAJASDEL DISPOSITIVO [employer] DIU hormonal puede estar asociado con patrones de sangrado irregular.  El DIU de cobre puede hacer que el flujo menstrual ms abundante y doloroso.  Puede experimentar clicos y sangrado vaginal despus de la insercin. Esta informacin no tiene Theme park manager el consejo del mdico. Asegrese de hacerle al mdico cualquier pregunta que tenga. Document Released: 03/31/2010 Document Revised: 02/02/2016 Document Reviewed: 04/01/2013 Elsevier Interactive Patient Education  2017 [employer]. Levonorgestrel intrauterine device (IUD) Qu es este medicamento? El DIU CON LEVONORGESTREL es un dispositivo anticonceptivo (control de la natalidad). Un profesional de Engineer, drilling dispositivo dentro el tero. Se [place] para evitar los embarazos. Este dispositivo tambin puede usarse para tratar el sangrado intenso que se produce durante el perodo menstrual. Este medicamento puede ser utilizado para otros usos; si tiene alguna pregunta consulte con su proveedor de atencin mdica o con su farmacutico. MARCAS COMUNES: Rutha Bouchard, [place], [place], Skyla Qu le debo informar a mi profesional de la salud antes de tomar este medicamento? Necesitan saber si usted presenta alguno de los [employer] o situaciones: examen de Papanicolaou anormal cncer de mama, tero o crvix diabetes endometritis infeccin genital o plvica actual o en el pasado tener ms de una pareja sexual o si su pareja tiene ms de una pareja enfermedad cardiaca antecedentes de Psychiatrist ectpico o tubrico problemas del sistema inmunolgico DIU colocado en su lugar enfermedad o tumor heptico problemas con cogulos sanguneos o tomar anticoagulantes convulsiones uso de drogas intravenosas tero con forma inusual  sangrado vaginal que no ha sido explicado Runner, broadcasting/film/video o inusual al levonorgestrel, a otras hormonas, a la silicona, o al  polietileno, a otros medicamentos, alimentos, colorantes o conservantes si est embarazada o buscando quedar embarazada si est amamantando a un beb Cmo debo [employer] Corporation? Un profesional de Naval architect este dispositivo en el tero. Hable con su pediatra para informarse acerca del uso de este medicamento en nios. Puede requerir atencin especial. Sobredosis: Pngase en contacto inmediatamente con un centro toxicolgico o una sala de urgencia si usted cree que haya tomado demasiado medicamento. ATENCIN: Reynolds American es solo para usted. No comparta este medicamento con nadie. Qu sucede si me olvido de una dosis? No se aplica en este caso. Segn la marca del dispositivo que tenga insertado, el dispositivo deber reemplazarse cada 3 a 5 aos si desea continuar usando este tipo de mtodo anticonceptivo. Qu puede interactuar con este medicamento? No tome este medicamento con ninguno de los siguientes frmacos: amprenavir bosentano fosamprenavir Este medicamento tambin puede interactuar con los siguientes medicamentos: aprepitant armodafinilo barbitricos para inducir el sueo o para el tratamiento de convulsiones bexaroteno boceprevir griseofulvina medicamentos para tratar convulsiones, tales como carbamazepina, etotona, [place], [place], [place], topiramato modafinilo pioglitazona rifabutina rifampicina rifapentina algunos medicamentos para tratar la infeccin por el VIH, tales como atazanavir, efavirenz, indinavir, lopinavir, nelfinavir, tipranavir, ritonavir hierba de [place] warfarina Puede ser que esta lista no menciona todas las posibles interacciones. Informe a su profesional de [employer] de [employer] Inc productos a base de hierbas, medicamentos de [place] o suplementos nutritivos que est tomando. Si usted fuma, consume bebidas alcohlicas o si utiliza drogas ilegales, indqueselo tambin a su profesional de [employer]. Algunas sustancias pueden interactuar con  su medicamento. A qu debo estar atento al usar PPL Corporation? Visite a su mdico o a su profesional de la salud para revisar su evolucin peridicamente. Consulte a su mdico si usted o su pareja tiene contacto sexual con Economist, descubre que tiene VIH positivo, o contrae una enfermedad de transmisin sexual. Cleda Clarks producto no la protege de la infeccin por VIH (SIDA) ni de ninguna otra enfermedad de transmisin sexual. Puede verificar la colocacin del DIU usted misma colocando los dedos limpios en la parte superior de la vagina para tocar los hilos. No tire de los hilos. Es un buen hbito verificar la colocacin del DIU despus de cada perodo menstrual. Llame a su mdico de inmediato si siente ms del DIU que solo los hilos o si no puede sentir los hilos. El DIU podra salirse solo. Es posible que quede embarazada si el dispositivo se sale. Si nota que se ha [employer] use un mtodo anticonceptivo de respaldo, [place] condones, y llame a su proveedor de Psychologist, prison and probation services. Usar tampones no cambiar la posicin del DIU y puede usarlos sin problema durante sus perodos menstruales. Este DIU puede escanearse en forma segura en una resonancia magntica (MRI) solo bajo condiciones especficas. Antes de realizarse Johnson [employer] Johnson, informe a su proveedor de atencin mdica que tiene colocado un DIU, y el tipo de DIU que tiene. Qu efectos secundarios puedo tener al [employer] Scientific este medicamento? Efectos secundarios que debe informar a su mdico o a Producer, television/film/video de la salud tan pronto como sea posible: -Therapist, art como erupcin cutnea, picazn o urticarias, hinchazn de la cara, labios o lengua -fiebre, sntomas gripales -llagas genitales -alta presin sangunea -ausencia de un perodo menstrual durante 6 semanas mientras lo utiliza -dolor, hinchazn o calor en  las piernas -dolor o sensibilidad del plvico -dolor de cabeza repentino o severo -signos de Psychiatristembarazo -calambres  estomacales -falta de aliento repentina -problemas de coordinacin, del habla, al caminar -sangrado, flujo vaginal inusual -color amarillento de los ojos o la piel Efectos secundarios que, por lo general, no requieren Psychologist, prison and probation servicesatencin mdica (debe informarlos a su mdico o a Producer, television/film/videosu profesional de la salud si persisten o si son molestos): -acn -dolor de pecho -cambios en el deseo sexual o capacidad -cambios de peso -calambres, Research scientist (life sciences)mareos o sensacin de The Pepsidesmayo mientras se introduce el dispositivo -dolor de cabeza -sangrado menstruales irregulares en los primeros 3 a 6 meses de usar -nuseas Puede ser que esta lista no menciona todos los posibles efectos secundarios. Comunquese a su mdico por asesoramiento mdico Hewlett-Packardsobre los efectos secundarios. Usted puede informar los efectos secundarios a la FDA por telfono al 1-800-FDA-1088. Dnde debo guardar mi medicina? No se aplica en este caso. ATENCIN: Este folleto es un resumen. Puede ser que no cubra toda la posible informacin. Si usted tiene preguntas acerca de esta medicina, consulte con su mdico, su farmacutico o su profesional de Radiographer, therapeuticla salud.  2018 Elsevier/Gold Standard (2016-11-11 00:00:00)

## 2017-01-14 ENCOUNTER — Ambulatory Visit (HOSPITAL_COMMUNITY)
Admission: RE | Admit: 2017-01-14 | Discharge: 2017-01-14 | Disposition: A | Discharge status: Home / Self Care | Payer: Self-pay | Source: Ambulatory Visit | Attending: Obstetrics and Gynecology | Admitting: Obstetrics and Gynecology

## 2017-01-14 ENCOUNTER — Encounter (HOSPITAL_COMMUNITY): Payer: Self-pay

## 2017-01-14 ENCOUNTER — Other Ambulatory Visit: Payer: Self-pay | Admitting: Obstetrics and Gynecology

## 2017-01-14 ENCOUNTER — Ambulatory Visit (HOSPITAL_COMMUNITY): Admission: RE | Admit: 2017-01-14 | Payer: Self-pay | Source: Ambulatory Visit

## 2017-01-14 DIAGNOSIS — Z315 Encounter for genetic counseling: Secondary | ICD-10-CM | POA: Insufficient documentation

## 2017-01-14 DIAGNOSIS — O09521 Supervision of elderly multigravida, first trimester: Secondary | ICD-10-CM | POA: Insufficient documentation

## 2017-01-14 DIAGNOSIS — Z3682 Encounter for antenatal screening for nuchal translucency: Secondary | ICD-10-CM | POA: Insufficient documentation

## 2017-01-14 DIAGNOSIS — Z3A12 12 weeks gestation of pregnancy: Secondary | ICD-10-CM | POA: Insufficient documentation

## 2017-01-14 DIAGNOSIS — O0990 Supervision of high risk pregnancy, unspecified, unspecified trimester: Secondary | ICD-10-CM

## 2017-01-14 DIAGNOSIS — O09529 Supervision of elderly multigravida, unspecified trimester: Secondary | ICD-10-CM

## 2017-01-14 DIAGNOSIS — O30041 Twin pregnancy, dichorionic/diamniotic, first trimester: Secondary | ICD-10-CM | POA: Insufficient documentation

## 2017-01-14 HISTORY — DX: Other specified health status: Z78.9

## 2017-01-14 NOTE — Progress Notes (Signed)
Genetic Counseling  High-Risk Gestation Note  Appointment Date:  01/14/2017 Referred By: Catalina Antigua, MD Date of Birth:  01/01/1976    Pregnancy History: [id number] Estimated Date of Delivery: 07/23/17 Estimated Gestational Age: [redacted]w[redacted]d Attending: Particia Nearing, MD   [name] was seen for genetic counseling because of a maternal age of 41 y.o. in dichorionic/diamniotic twin gestation. Ashlan from [employer] provided medical English/Spanish interpretation for today's visit.    In summary:  Discussed AMA and associated risk for fetal aneuploidy  Discussed options for screening  First screen- declined  Quad screen- declined  NIPS- declined  Ultrasound- NT ultrasound today; detailed anatomy scan scheduled 02/23/17  Discussed diagnostic testing options  Amniocentesis- declined  Reviewed family history concerns  Discussed carrier screening options  CF  SMA  Hemoglobinopathies  She was counseled regarding maternal age and the association with risk for chromosome conditions due to nondisjunction with aging of the ova.   We reviewed chromosomes, nondisjunction, and the associated 1 in 44 risk for fetal aneuploidy related to a maternal age of 41 y.o. at [redacted]w[redacted]d gestation for each twin.  She was counseled that the risk for aneuploidy decreases as gestational age increases, accounting for those pregnancies which spontaneously abort.  We specifically discussed Down syndrome (trisomy 65), trisomies 19 and 55, and sex chromosome aneuploidies (47,XXX and 47,XXY) including the common features and prognoses of each.   We reviewed available screening options including First Screen, Quad screen, noninvasive prenatal screening (NIPS)/cell free DNA (cfDNA) screening, and detailed ultrasound.  She was counseled that screening tests are used to modify a patient's a priori risk for aneuploidy, typically based on age. This estimate provides a pregnancy specific risk assessment. We  reviewed the benefits and limitations of each option. Specifically, we discussed the conditions for which each test screens, the detection rates, and false positive rates of each. Sensitivity and in some cases the conditions for which screening can be performed are reduced in twin gestations. She was also counseled regarding diagnostic testing via amniocentesis. We reviewed the approximate 1 in 300-500 risk for complications from amniocentesis, including spontaneous pregnancy loss. We discussed the possible results that the tests might provide including: positive, negative, unanticipated, and no result. Finally, they were counseled regarding the cost of each option and potential out of pocket expenses. After consideration of all the options, she elected to proceed with ultrasound only in pregnancy. She declined additional screening or testing for fetal aneuploidy including maternal serum screen (First screen, Quad screen), NIPS, and amniocentesis.     A nuchal translucency ultrasound was performed today.  The report will be documented separately.  Detailed ultrasound is scheduled for 02/23/17. She understands that screening tests cannot rule out all birth defects or genetic syndromes. The patient was advised of this limitation and states she still does not want additional testing at this time.    [name] was provided with written information regarding cystic fibrosis (CF), spinal muscular atrophy (SMA) and hemoglobinopathies including the carrier frequency, availability of carrier screening and prenatal diagnosis if indicated.  In addition, we discussed that CF and hemoglobinopathies are routinely screened for as part of the [place] newborn screening panel.  She declined screening for CF, SMA and hemoglobinopathies.  Both family histories were reviewed and found to be noncontributory for birth defects, intellectual disability, and known genetic conditions. Without further information regarding the  provided family history, an accurate genetic risk cannot be calculated. Further genetic counseling is warranted if more information is  obtained.   [name] denied exposure to environmental toxins or chemical agents. She denied the use of alcohol, tobacco or street drugs. She denied significant viral illnesses during the course of her pregnancy. Her medical and surgical histories were noncontributory.   I counseled [name] regarding the above risks and available options.  The approximate face-to-face time with the genetic counselor was 35 minutes.  Roberta PlowmanKaren Lis Savitt, MS,  Certified [employer] Companiesenetic Counselor 01/14/2017

## 2017-02-03 ENCOUNTER — Ambulatory Visit (INDEPENDENT_AMBULATORY_CARE_PROVIDER_SITE_OTHER): Payer: Self-pay | Admitting: Obstetrics and Gynecology

## 2017-02-03 VITALS — BP 116/66 | HR 67 | Wt 186.3 lb

## 2017-02-03 DIAGNOSIS — Z283 Underimmunization status: Secondary | ICD-10-CM

## 2017-02-03 DIAGNOSIS — O0990 Supervision of high risk pregnancy, unspecified, unspecified trimester: Secondary | ICD-10-CM

## 2017-02-03 DIAGNOSIS — O30049 Twin pregnancy, dichorionic/diamniotic, unspecified trimester: Secondary | ICD-10-CM

## 2017-02-03 DIAGNOSIS — O30042 Twin pregnancy, dichorionic/diamniotic, second trimester: Secondary | ICD-10-CM

## 2017-02-03 DIAGNOSIS — O9989 Other specified diseases and conditions complicating pregnancy, childbirth and the puerperium: Secondary | ICD-10-CM

## 2017-02-03 DIAGNOSIS — O0992 Supervision of high risk pregnancy, unspecified, second trimester: Secondary | ICD-10-CM

## 2017-02-03 DIAGNOSIS — O09522 Supervision of elderly multigravida, second trimester: Secondary | ICD-10-CM

## 2017-02-03 DIAGNOSIS — O09529 Supervision of elderly multigravida, unspecified trimester: Secondary | ICD-10-CM

## 2017-02-03 DIAGNOSIS — Z2839 Other underimmunization status: Secondary | ICD-10-CM

## 2017-02-03 NOTE — Progress Notes (Signed)
   PRENATAL VISIT NOTE  Subjective:  Roberta Marshall is a 41 y.o. [phone] at [redacted]w[redacted]d being seen today for ongoing prenatal care.  She is currently monitored for the following issues for this high-risk pregnancy and has Maternal age 69+, multigravida, antepartum; Dichorionic diamniotic twin pregnancy, antepartum; Supervision of high risk pregnancy, antepartum; and Rubella non-immune status, antepartum on her problem list.  Patient reports no complaints.  Contractions: Not present. Vag. Bleeding: None.  Movement: Present. Denies leaking of fluid.   The following portions of the patient's history were reviewed and updated as appropriate: allergies, current medications, past family history, past medical history, past social history, past surgical history and problem list. Problem list updated.  Objective:   Vitals:   02/03/17 0947  Weight: 186 lb 4.8 oz (84.5 kg)    Fetal Status:     Movement: Present     General:  Alert, oriented and cooperative. Patient is in no acute distress.  Skin: Skin is warm and dry. No rash noted.   Cardiovascular: Normal heart rate noted  Respiratory: Normal respiratory effort, no problems with respiration noted  Abdomen: Soft, gravid, appropriate for gestational age. Pain/Pressure: Absent     Pelvic:  Cervical exam deferred        Extremities: Normal range of motion.  Edema: None  Mental Status: Normal mood and affect. Normal behavior. Normal judgment and thought content.   Assessment and Plan:  Pregnancy: G4P3003 at [redacted]w[redacted]d  1. Dichorionic diamniotic twin pregnancy, antepartum Follow up anatomy ultrasound- already scheduled  2. Rubella non-immune status, antepartum Will offer pp  3. Maternal age 37+, multigravida, antepartum Normal NT x 2. Patient declined further testing including first screen and NIPS  4. Supervision of high risk pregnancy, antepartum Patient is doing well without complaints 1hr glucola today  General obstetric precautions  including but not limited to vaginal bleeding, contractions, leaking of fluid and fetal movement were reviewed in detail with the patient. Please refer to After Visit Summary for other counseling recommendations.  Return in about 4 weeks (around 03/03/2017) for ROB.   Catalina Antigua, MD

## 2017-02-03 NOTE — Progress Notes (Signed)
Spanish Interpreter Clent Ridges

## 2017-02-04 LAB — GLUCOSE TOLERANCE, 1 HOUR: GLUCOSE, 1HR PP: 103 mg/dL (ref 65–199)

## 2017-02-23 ENCOUNTER — Other Ambulatory Visit: Payer: Self-pay | Admitting: Obstetrics & Gynecology

## 2017-02-23 ENCOUNTER — Ambulatory Visit (HOSPITAL_COMMUNITY)
Admission: RE | Admit: 2017-02-23 | Discharge: 2017-02-23 | Disposition: A | Discharge status: Home / Self Care | Payer: Self-pay | Source: Ambulatory Visit | Attending: Obstetrics & Gynecology | Admitting: Obstetrics & Gynecology

## 2017-02-23 ENCOUNTER — Encounter (HOSPITAL_COMMUNITY): Payer: Self-pay

## 2017-02-23 DIAGNOSIS — Z363 Encounter for antenatal screening for malformations: Secondary | ICD-10-CM | POA: Insufficient documentation

## 2017-02-23 DIAGNOSIS — O30049 Twin pregnancy, dichorionic/diamniotic, unspecified trimester: Secondary | ICD-10-CM

## 2017-02-23 DIAGNOSIS — Z283 Underimmunization status: Secondary | ICD-10-CM

## 2017-02-23 DIAGNOSIS — O09522 Supervision of elderly multigravida, second trimester: Secondary | ICD-10-CM | POA: Insufficient documentation

## 2017-02-23 DIAGNOSIS — O09529 Supervision of elderly multigravida, unspecified trimester: Secondary | ICD-10-CM

## 2017-02-23 DIAGNOSIS — O09892 Supervision of other high risk pregnancies, second trimester: Secondary | ICD-10-CM | POA: Insufficient documentation

## 2017-02-23 DIAGNOSIS — Z3A18 18 weeks gestation of pregnancy: Secondary | ICD-10-CM | POA: Insufficient documentation

## 2017-02-23 DIAGNOSIS — O09899 Supervision of other high risk pregnancies, unspecified trimester: Secondary | ICD-10-CM

## 2017-02-23 DIAGNOSIS — O9989 Other specified diseases and conditions complicating pregnancy, childbirth and the puerperium: Secondary | ICD-10-CM

## 2017-02-23 DIAGNOSIS — O0990 Supervision of high risk pregnancy, unspecified, unspecified trimester: Secondary | ICD-10-CM

## 2017-03-17 ENCOUNTER — Ambulatory Visit (INDEPENDENT_AMBULATORY_CARE_PROVIDER_SITE_OTHER): Payer: Self-pay | Admitting: Advanced Practice Midwife

## 2017-03-17 VITALS — BP 110/63 | HR 73 | Wt 199.8 lb

## 2017-03-17 DIAGNOSIS — G43909 Migraine, unspecified, not intractable, without status migrainosus: Secondary | ICD-10-CM

## 2017-03-17 DIAGNOSIS — O30049 Twin pregnancy, dichorionic/diamniotic, unspecified trimester: Secondary | ICD-10-CM

## 2017-03-17 DIAGNOSIS — O09522 Supervision of elderly multigravida, second trimester: Secondary | ICD-10-CM

## 2017-03-17 DIAGNOSIS — O30042 Twin pregnancy, dichorionic/diamniotic, second trimester: Secondary | ICD-10-CM

## 2017-03-17 DIAGNOSIS — O09529 Supervision of elderly multigravida, unspecified trimester: Secondary | ICD-10-CM

## 2017-03-17 DIAGNOSIS — O0990 Supervision of high risk pregnancy, unspecified, unspecified trimester: Secondary | ICD-10-CM

## 2017-03-17 DIAGNOSIS — O1202 Gestational edema, second trimester: Secondary | ICD-10-CM

## 2017-03-17 DIAGNOSIS — O0992 Supervision of high risk pregnancy, unspecified, second trimester: Secondary | ICD-10-CM

## 2017-03-17 NOTE — Patient Instructions (Signed)
Embarazo mltiple (Multiple Pregnancy) Se habla de embarazo mltiple cuando una mujer espera ms de un beb al Arrow Electronicsmismo tiempo. Puede estar embarazada de gemelos, trillizos o ms. En la International Business Machinesmayora de los casos, los embarazos mltiples son de Sweetwatermellizos. Es muy poco frecuente concebir de forma natural trillizos o ms cantidad de fetos. Los embarazos mltiples son ms riesgosos que los embarazos de un solo feto. Una mujer con un embarazo mltiple es ms propensa a Community education officertener ciertos problemas durante el embarazo. Por lo tanto, necesitar ms citas para los controles prenatales. CMO OCURRE UN EMBARAZO MLTIPLE? Un embarazo mltiple ocurre cuando:  El cuerpo de la mujer libera ms un vulo al mismo tiempo y luego cada vulo es fertilizado por un espermatozoide diferente.  Este es el tipo de embarazo mltiple ms frecuente.  Los gemelos o ms bebs producidos de Andrew Auesta manera son fraternos. No son ms parecidos que los hermanos que nacieron de embarazos individuales.  Un espermatozoide fertiliza un vulo, que luego se divide en ms de un embrin.  Los gemelos o ms bebs producidos de Andrew Auesta manera son idnticos. En este tipo de Northrop Grummanembarazos mltiples, los bebs son siempre del mismo sexo y muy parecidos. Devota PaceQUINES TIENEN MS PROBABILIDADES DE Cyndi LennertENER UN [facility] LLCEMBARAZO MLTIPLE? Existen ms probabilidades de Research officer, political partyun embarazo mltiple en mujeres que:  Realizaron un tratamiento de fertilidad, especialmente si el tratamiento incluy frmacos de fertilidad.  Son Parismayores de [place]35aos.  Ya tuvo cuatro hijos o ms.  Tiene antecedentes familiares de embarazos mltiples. CMO SE DIAGNOSTICA UN EMBARAZO MLTIPLE? Un embarazo mltiple se puede diagnosticar en funcin de:  Sntomas como los siguientes:  Aumento rpido de peso en los primeros 3meses de Psychiatristembarazo (primer trimestre).  Nuseas ms intensas y ms dolor en las mamas a la palpacin de lo que es tpico en un embarazo individual.  tero ms grande de lo normal para la  etapa del Psychiatristembarazo.  Anlisis de [place] Life Insurancesangre que detectan un nivel de gonadotropina corinica humana [facility] superior al normal. Esta es una hormona producida por el cuerpo al comienzo del Psychiatristembarazo.  Ecografa. Este estudio se [place]utiliza para confirmar un Medical illustratorembarazo mltiple. QU RIESGOS ESTN RELACIONADOS CON UN EMBARAZO MLTIPLE? Un embarazo mltiple la pone en un riesgo mayor de ciertos problemas durante o despus del [place] Portembarazo, que incluyen:  Dar a luz los bebs antes de que el Psychiatristembarazo llegue a trmino (parto prematuro). Un embarazo a trmino comprende un mnimo de 37semanas. Los bebs nacidos antes de las 37semanas tienen un riesgo mayor de sufrir una variedad de problemas, como problemas respiratorios, dificultades en la alimentacin, parlisis cerebral y dificultades de aprendizaje.  Diabetes.  Preeclampsia. Esta es una afeccin grave que provoca el aumento de la presin arterial junto con otros sntomas, como hinchazn y dolores de Turkmenistancabeza, Academic librariandurante el embarazo.  Prdida excesiva de sangre despus del parto (hemorragia posparto).  Depresin posparto.  Bebs con bajo peso al nacer. CMO AFECTAR MI CUIDADO UN [facility] CenterEMBARAZO MLTIPLE? Su mdico querr controlarla ms de cerca durante el embarazo para garantizar que los bebs estn creciendo normalmente y que usted est saludable. SIGA ESTAS INDICACIONES EN SU CASA: Debido a que el embarazo se considera de alto riesgo, deber trabajar en estrecha colaboracin con el equipo de mdicos. Tambin es posible que deba hacer algunos cambios en el estilo de vida. Estas pueden incluir las siguientes: Comida y bebida  Mejore la nutricin.  Siga las recomendaciones de su mdico para aumentar de [place]o. Es posible que deba aumentar algo de peso extra si el embarazo es mltiple.  Coma bocadillos saludables varias veces durante el da. De esta forma, puede agregar caloras y reducir las nuseas.  Beba suficiente lquido para Photographer orina clara o de color  amarillo plido.  Tomar las vitaminas prenatales. Actividad Entre la semana 20 y 24, es posible que deba limitar sus [place].  Evite las actividades y los trabajos que demanden mucho esfuerzo (extenuantes).  Consulte al mdico cundo debe dejar de Management consultant.  Descanse con frecuencia. Instrucciones generales  No consuma ningn producto que contenga nicotina o tabaco, como cigarrillos y Administrator, Civil Service. Si necesita ayuda para dejar de fumar, consulte al mdico.  No beba alcohol ni consuma drogas.  [employer] medicamentos de venta libre y los recetados solamente como se lo haya indicado el mdico.  Maricela Curet arreglos para recibir ayuda adicional con las tareas de la casa.  Concurra a todas las visitas de control y a todas las visitas prenatales como se lo haya indicado el mdico. Esto es importante. SOLICITE ATENCIN MDICA SI:  Tiene mareos.  Tiene nuseas, vmitos o diarrea persistentes.  Tiene dificultad para aumentar de peso.  Tiene sentimientos de depresin u otras emociones que interfieren con sus actividades normales. SOLICITE ATENCIN MDICA DE INMEDIATO SI:  Tiene fiebre.  Siente dolor al [employer].  Tiene una prdida de lquido por la vagina.  Brett Fairy secrecin vaginal con mal olor.  Observa ms hinchazn en la cara, las manos, las piernas o los tobillos.  Tiene sangrado o pequeas prdidas vaginales.  Siente calambres en la pelvis, presin en la pelvis o dolor persistente en el abdomen o en la parte baja de la espalda.  Tiene contracciones regulares.  Tiene dolor de cabeza intenso, con o sin cambios visuales.  Tiene dificultad para respirar o Engineer, maintenance.  Advierte menos movimiento o ningn movimiento fetal. Esta informacin no tiene como fin reemplazar el consejo del mdico. Asegrese de hacerle al mdico cualquier pregunta que tenga. Document Released: 08/01/2013 Document Revised: 02/02/2016 Document Reviewed:  04/27/2016 Elsevier Interactive Patient Education  2017 [employer].

## 2017-03-17 NOTE — Progress Notes (Signed)
   PRENATAL VISIT NOTE  Subjective:  Roberta Marshall is a 41 y.o. [phone] at 2561w5d being seen today for ongoing prenatal care.  She is currently monitored for the following issues for this high-risk pregnancy and has Maternal age 39+, multigravida, antepartum; Dichorionic diamniotic twin pregnancy, antepartum; Supervision of high risk pregnancy, antepartum; and Rubella non-immune status, antepartum on her problem list.  Patient reports headache and swelling in her feet. Patient has a history of migraines since 41 years old and reports them at the end of the week. Patient also works long hours on her feet and is having more swelling in both feet.  Contractions: Not present. Vag. Bleeding: None.  Movement: Present. Denies leaking of fluid.   The following portions of the patient's history were reviewed and updated as appropriate: allergies, current medications, past family history, past medical history, past social history, past surgical history and problem list. Problem list updated.  Objective:   Vitals:   03/17/17 0859  BP: 110/63  Pulse: 73  Weight: 199 lb 12.8 oz (90.6 kg)    Fetal Status: Fetal Heart Rate (bpm): 125/140   Movement: Present     General:  Alert, oriented and cooperative. Patient is in no acute distress.  Skin: Skin is warm and dry. No rash noted.   Cardiovascular: Normal heart rate noted  Respiratory: Normal respiratory effort, no problems with respiration noted  Abdomen: Soft, gravid, appropriate for gestational age. Pain/Pressure: Absent     Pelvic:  Cervical exam deferred        Extremities: Normal range of motion.  Edema: Mild pitting, slight indentation  Mental Status: Normal mood and affect. Normal behavior. Normal judgment and thought content.   Assessment and Plan:  Pregnancy: G4P3003 at 4561w5d  1. Supervision of high risk pregnancy, antepartum Discussed use of support belt and compression stocking to help with swelling in her feet.  Support measures  for headaches including tylenol and increasing water intake.   2. Maternal age 39+, multigravida, antepartum   3. Dichorionic diamniotic twin pregnancy, antepartum   4. Migraine without status migrainosus, not intractable, unspecified migraine type   5. Edema during pregnancy in second trimester  Preterm labor symptoms and general obstetric precautions including but not limited to vaginal bleeding, contractions, leaking of fluid and fetal movement were reviewed in detail with the patient. Please refer to After Visit Summary for other counseling recommendations.  Return in about 4 weeks (around 04/14/2017) for return OB.   Cleone SlimCaroline Dorris Pierre, Student-MidWife 03/17/17 9:42 AM  I have seen this pt and performed the physical exam and agree with the above nurse-midwife student.    Sharen CounterLisa Leftwich-Kirby, CNM 9:54 AM

## 2017-03-17 NOTE — Progress Notes (Signed)
Patient complain of having headaches at  the end of the week. Also patient complain of her feet hurting her while at work.

## 2017-04-01 ENCOUNTER — Ambulatory Visit (INDEPENDENT_AMBULATORY_CARE_PROVIDER_SITE_OTHER): Payer: Self-pay | Admitting: General Practice

## 2017-04-01 DIAGNOSIS — N898 Other specified noninflammatory disorders of vagina: Secondary | ICD-10-CM

## 2017-04-01 DIAGNOSIS — Z113 Encounter for screening for infections with a predominantly sexual mode of transmission: Secondary | ICD-10-CM

## 2017-04-01 DIAGNOSIS — B379 Candidiasis, unspecified: Secondary | ICD-10-CM

## 2017-04-01 DIAGNOSIS — B373 Candidiasis of vulva and vagina: Secondary | ICD-10-CM

## 2017-04-01 MED ORDER — TERCONAZOLE 0.4 % VA CREA: 1.0000 | TOPICAL_CREAM | Freq: Every day | VAGINAL | 0 refills | Status: AC

## 2017-04-01 NOTE — Progress Notes (Signed)
Patient here today for self swab. Raquel used for interpreter. Patient reports vaginal discharge & itching for past 4 days but worsening itching yesterday. Patient instructed in self swab. Told patient we will send in vaginal cream for presumed yeast infection & we will call her with results if results show more than yeast. Patient verbalized understanding & had no questions

## 2017-04-04 LAB — CERVICOVAGINAL ANCILLARY ONLY
BACTERIAL VAGINITIS: NEGATIVE
CANDIDA VAGINITIS: POSITIVE — AB
Trichomonas: NEGATIVE

## 2017-04-06 ENCOUNTER — Ambulatory Visit (HOSPITAL_COMMUNITY)
Admission: RE | Admit: 2017-04-06 | Discharge: 2017-04-06 | Disposition: A | Discharge status: Home / Self Care | Payer: Self-pay | Source: Ambulatory Visit | Attending: Obstetrics & Gynecology | Admitting: Obstetrics & Gynecology

## 2017-04-06 ENCOUNTER — Encounter (HOSPITAL_COMMUNITY): Payer: Self-pay

## 2017-04-06 ENCOUNTER — Other Ambulatory Visit (HOSPITAL_COMMUNITY): Payer: Self-pay | Admitting: *Deleted

## 2017-04-06 ENCOUNTER — Other Ambulatory Visit (HOSPITAL_COMMUNITY): Payer: Self-pay | Admitting: Maternal and Fetal Medicine

## 2017-04-06 DIAGNOSIS — O30042 Twin pregnancy, dichorionic/diamniotic, second trimester: Secondary | ICD-10-CM | POA: Insufficient documentation

## 2017-04-06 DIAGNOSIS — O30049 Twin pregnancy, dichorionic/diamniotic, unspecified trimester: Secondary | ICD-10-CM

## 2017-04-06 DIAGNOSIS — O30043 Twin pregnancy, dichorionic/diamniotic, third trimester: Secondary | ICD-10-CM

## 2017-04-06 DIAGNOSIS — Z362 Encounter for other antenatal screening follow-up: Secondary | ICD-10-CM

## 2017-04-06 DIAGNOSIS — Z3A24 24 weeks gestation of pregnancy: Secondary | ICD-10-CM | POA: Insufficient documentation

## 2017-04-06 DIAGNOSIS — O09522 Supervision of elderly multigravida, second trimester: Secondary | ICD-10-CM

## 2017-04-06 NOTE — Addendum Note (Signed)
Encounter addended by: Vivien RotaSmall, Aairah Negrette H, RT on: 04/06/2017  9:40 AM<BR>    Actions taken: Imaging Exam ended

## 2017-04-14 ENCOUNTER — Ambulatory Visit (INDEPENDENT_AMBULATORY_CARE_PROVIDER_SITE_OTHER): Payer: Self-pay | Admitting: Obstetrics and Gynecology

## 2017-04-14 VITALS — BP 104/73 | HR 78 | Wt 206.5 lb

## 2017-04-14 DIAGNOSIS — Z789 Other specified health status: Secondary | ICD-10-CM | POA: Insufficient documentation

## 2017-04-14 DIAGNOSIS — O30049 Twin pregnancy, dichorionic/diamniotic, unspecified trimester: Secondary | ICD-10-CM

## 2017-04-14 DIAGNOSIS — I83893 Varicose veins of bilateral lower extremities with other complications: Secondary | ICD-10-CM | POA: Insufficient documentation

## 2017-04-14 DIAGNOSIS — O0992 Supervision of high risk pregnancy, unspecified, second trimester: Secondary | ICD-10-CM

## 2017-04-14 DIAGNOSIS — O0990 Supervision of high risk pregnancy, unspecified, unspecified trimester: Secondary | ICD-10-CM

## 2017-04-14 MED ORDER — ASPIRIN EC 81 MG PO TBEC: 81.0000 mg | DELAYED_RELEASE_TABLET | Freq: Every day | ORAL | 3 refills | Status: DC

## 2017-04-14 NOTE — Progress Notes (Signed)
Prenatal Visit Note Date: 04/14/2017 Clinic: [facility]  Subjective:  Roberta Marshall is a 41 y.o. [phone] at 5921w5d being seen today for ongoing prenatal care.  She is currently monitored for the following issues for this high-risk pregnancy and has Maternal age 535+, multigravida, antepartum; Dichorionic diamniotic twin pregnancy, antepartum; Supervision of high risk pregnancy, antepartum; Rubella non-immune status, antepartum; and Varicose veins of lower extremity with edema, bilateral on her problem list.  Patient reports b/l LE edema, stable.   Contractions: Not present. Vag. Bleeding: None.  Movement: Present. Denies leaking of fluid.   The following portions of the patient's history were reviewed and updated as appropriate: allergies, current medications, past family history, past medical history, past social history, past surgical history and problem list. Problem list updated.  Objective:   Vitals:   04/14/17 1417  BP: 104/73  Pulse: 78  Weight: 206 lb 8 oz (93.7 kg)    Fetal Status: Fetal Heart Rate (bpm): 134/140   Movement: Present     General:  Alert, oriented and cooperative. Patient is in no acute distress.  Skin: Skin is warm and dry. No rash noted.   Cardiovascular: Normal heart rate noted  Respiratory: Normal respiratory effort, no problems with respiration noted  Abdomen: Soft, gravid, appropriate for gestational age. Pain/Pressure: Present     Pelvic:  Cervical exam deferred        Extremities: Normal range of motion.  Edema: Mild pitting, slight indentation  Mental Status: Normal mood and affect. Normal behavior. Normal judgment and thought content.   Urinalysis:      Assessment and Plan:  Pregnancy: G4P3003 at 3821w5d  1. Varicose veins of lower extremity with edema, bilateral D/w her re: behavioral methods. Pt already wearing stockings; legs stable. Works as Conservation officer, naturecashier  2. Dichorionic diamniotic twin pregnancy, antepartum Normal growth  earlier this month. Has repeat scan already scheduled. Recommend she start a baby ASA  3. Supervision of high risk pregnancy, antepartum Routine care  Preterm labor symptoms and general obstetric precautions including but not limited to vaginal bleeding, contractions, leaking of fluid and fetal movement were reviewed in detail with the patient. Please refer to After Visit Summary for other counseling recommendations.  Return in about 2 weeks (around 04/28/2017) for rob and 28wk labs.   [place] BingPickens, Roberta Dowe, MD

## 2017-04-14 NOTE — Progress Notes (Signed)
C/o back pain, recommended Tylenol Spanish video interpreter "Gershon Cullriscilla" [phone] used during visit

## 2017-04-28 ENCOUNTER — Ambulatory Visit (INDEPENDENT_AMBULATORY_CARE_PROVIDER_SITE_OTHER): Payer: Self-pay | Admitting: Advanced Practice Midwife

## 2017-04-28 VITALS — BP 124/65 | HR 92 | Wt 210.0 lb

## 2017-04-28 DIAGNOSIS — O0993 Supervision of high risk pregnancy, unspecified, third trimester: Secondary | ICD-10-CM

## 2017-04-28 DIAGNOSIS — O30043 Twin pregnancy, dichorionic/diamniotic, third trimester: Secondary | ICD-10-CM

## 2017-04-28 NOTE — Progress Notes (Signed)
   PRENATAL VISIT NOTE  Subjective:  Roberta Marshall is a [age] y.o. [phone] at 3926w5d being seen today for ongoing prenatal care.  She is currently monitored for the following issues for this high-risk pregnancy and has Maternal age 39+, multigravida, antepartum; Dichorionic diamniotic twin pregnancy, antepartum; Supervision of high risk pregnancy, antepartum; Rubella non-immune status, antepartum; Varicose veins of lower extremity with edema, bilateral; and Language barrier on her problem list.  Patient reports no complaints.  Contractions: Not present. Vag. Bleeding: None.  Movement: Present. Denies leaking of fluid.   The following portions of the patient's history were reviewed and updated as appropriate: allergies, current medications, past family history, past medical history, past social history, past surgical history and problem list. Problem list updated.  Objective:   Vitals:   04/28/17 0831  BP: 124/65  Pulse: 92  Weight: 210 lb (95.3 kg)    Fetal Status: Fetal Heart Rate (bpm): 140/147   Movement: Present     General:  Alert, oriented and cooperative. Patient is in no acute distress.  Skin: Skin is warm and dry. No rash noted.   Cardiovascular: Normal heart rate noted  Respiratory: Normal respiratory effort, no problems with respiration noted  Abdomen: Soft, gravid, appropriate for gestational age. Pain/Pressure: Absent     Pelvic:  Cervical exam deferred      Fundal height 38 cm  Extremities: Normal range of motion.  Edema: Mild pitting, slight indentation  Mental Status: Normal mood and affect. Normal behavior. Normal judgment and thought content.   Assessment and Plan:  Pregnancy: G4P3003 at 3926w5d  1. Supervision of high risk pregnancy, antepartum, third trimester  Needs 2 hour GTT, not fasting today will schedule within the week   2. Dichorionic diamniotic twin pregnancy in third trimester   Preterm labor symptoms and general obstetric precautions including but  not limited to vaginal bleeding, contractions, leaking of fluid and fetal movement were reviewed in detail with the patient. Please refer to After Visit Summary for other counseling recommendations.  Return in about 2 weeks (around 05/12/2017) for routine visit, but needs a lab visit in the next few days. Marland Kitchen.   Thressa ShellerHeather Hogan, CNM Thressa ShellerHeather Hogan 9:07 AM 04/28/17

## 2017-04-28 NOTE — Patient Instructions (Signed)
Tercer trimestre de embarazo (Third Trimester of Pregnancy) El tercer trimestre comprende desde la semana29 hasta la semana42, es decir, desde el mes7 hasta el mes9. El tercer trimestre es un perodo en el que el feto crece rpidamente. Hacia el final del noveno mes, el feto mide alrededor de 20pulgadas (45cm) de largo y pesa entre 6 y 10 libras (2,700 y 4,500kg). CAMBIOS EN EL ORGANISMO Su organismo atraviesa por muchos cambios durante el embarazo, y estos varan de una mujer a otra.  Seguir aumentando de peso. Es de esperar que aumente entre 25 y 35libras (11 y 16kg) hacia el final del embarazo.  Podrn aparecer las primeras estras en las caderas, el abdomen y las mamas.  Puede tener necesidad de orinar con ms frecuencia porque el feto baja hacia la pelvis y ejerce presin sobre la vejiga.  Debido al embarazo podr sentir acidez estomacal con frecuencia.  Puede estar estreida, ya que ciertas hormonas enlentecen los movimientos de los msculos que empujan los desechos a travs de los intestinos.  Pueden aparecer hemorroides o abultarse e hincharse las venas (venas varicosas).  Puede sentir dolor plvico debido al aumento de peso y a que las hormonas del embarazo relajan las articulaciones entre los huesos de la pelvis. El dolor de espalda puede ser consecuencia de la sobrecarga de los msculos que soportan la postura.  Tal vez haya cambios en el cabello que pueden incluir su engrosamiento, crecimiento rpido y cambios en la textura. Adems, a algunas mujeres se les cae el cabello durante o despus del embarazo, o tienen el cabello seco o fino. Lo ms probable es que el cabello se le normalice despus del nacimiento del beb.  Las mamas seguirn creciendo y le dolern. A veces, puede haber una secrecin amarilla de las mamas llamada calostro.  El ombligo puede salir hacia afuera.  Puede sentir que le falta el aire debido a que se expande el tero.  Puede notar que el feto  "baja" o lo siente ms bajo, en el abdomen.  Puede tener una prdida de secrecin mucosa con sangre. Esto suele ocurrir en el trmino de unos pocos das a una semana antes de que comience el trabajo de parto.  El cuello del tero se vuelve delgado y blando (se borra) cerca de la fecha de parto. QU DEBE ESPERAR EN LOS EXMENES PRENATALES Le harn exmenes prenatales cada 2semanas hasta la semana36. A partir de ese momento le harn exmenes semanales. Durante una visita prenatal de rutina:  La pesarn para asegurarse de que usted y el feto estn creciendo normalmente.  Le tomarn la presin arterial.  Le medirn el abdomen para controlar el desarrollo del beb.  Se escucharn los latidos cardacos fetales.  Se evaluarn los resultados de los estudios solicitados en visitas anteriores.  Le revisarn el cuello del tero cuando est prxima la fecha de parto para controlar si este se ha borrado. Alrededor de la semana36, el mdico le revisar el cuello del tero. Al mismo tiempo, realizar un anlisis de las secreciones del tejido vaginal. Este examen es para determinar si hay un tipo de bacteria, estreptococo Grupo B. El mdico le explicar esto con ms detalle. El mdico puede preguntarle lo siguiente:  Cmo le gustara que fuera el parto.  Cmo se siente.  Si siente los movimientos del beb.  Si ha tenido sntomas anormales, como prdida de lquido, sangrado, dolores de cabeza intensos o clicos abdominales.  Si est consumiendo algn producto que contenga tabaco, como cigarrillos, tabaco de mascar y   cigarrillos electrnicos.  Si tiene alguna pregunta. Otros exmenes o estudios de deteccin que pueden realizarse durante el tercer trimestre incluyen lo siguiente:  Anlisis de sangre para controlar los niveles de hierro (anemia).  Controles fetales para determinar su salud, nivel de actividad y crecimiento. Si tiene alguna enfermedad o hay problemas durante el embarazo, le harn  estudios.  Prueba del VIH (virus de inmunodeficiencia humana). Si corre un riesgo alto, pueden realizarle una prueba de deteccin del VIH durante el tercer trimestre del embarazo. FALSO TRABAJO DE PARTO Es posible que sienta contracciones leves e irregulares que finalmente desaparecen. Se llaman contracciones de Braxton Hicks o falso trabajo de parto. Las contracciones pueden durar horas, das o incluso semanas, antes de que el verdadero trabajo de parto se inicie. Si las contracciones ocurren a intervalos regulares, se intensifican o se hacen dolorosas, lo mejor es que la revise el mdico. SIGNOS DE TRABAJO DE PARTO  Clicos de tipo menstrual.  Contracciones cada 5minutos o menos.  Contracciones que comienzan en la parte superior del tero y se extienden hacia abajo, a la zona inferior del abdomen y la espalda.  Sensacin de mayor presin en la pelvis o dolor de espalda.  Una secrecin de mucosidad acuosa o con sangre que sale de la vagina. Si tiene alguno de estos signos antes de la semana37 del embarazo, llame a su mdico de inmediato. Debe concurrir al hospital para que la controlen inmediatamente. INSTRUCCIONES PARA EL CUIDADO EN EL HOGAR  Evite fumar, consumir hierbas, beber alcohol y tomar frmacos que no le hayan recetado. Estas sustancias qumicas afectan la formacin y el desarrollo del beb.  No consuma ningn producto que contenga tabaco, lo que incluye cigarrillos, tabaco de mascar y cigarrillos electrnicos. Si necesita ayuda para dejar de fumar, consulte al mdico. Puede recibir asesoramiento y otro tipo de recursos para dejar de fumar.  Siga las indicaciones del mdico en relacin con el uso de medicamentos. Durante el embarazo, hay medicamentos que son seguros de tomar y otros que no.  Haga ejercicio solamente como se lo haya indicado el mdico. Sentir clicos uterinos es un buen signo para detener la actividad fsica.  Contine comiendo alimentos sanos con  regularidad.  Use un sostn que le brinde buen soporte si le duelen las mamas.  No se d baos de inmersin en agua caliente, baos turcos ni saunas.  Use el cinturn de seguridad en todo momento mientras conduce.  No coma carne cruda ni queso sin cocinar; evite el contacto con las bandejas sanitarias de los gatos y la tierra que estos animales usan. Estos elementos contienen grmenes que pueden causar defectos congnitos en el beb.  Tome las vitaminas prenatales.  Tome entre 1500 y 2000mg de calcio diariamente comenzando en la semana20 del embarazo hasta el parto.  Si est estreida, pruebe un laxante suave (si el mdico lo autoriza). Consuma ms alimentos ricos en fibra, como vegetales y frutas frescos y cereales integrales. Beba gran cantidad de lquido para mantener la orina de tono claro o color amarillo plido.  Dese baos de asiento con agua tibia para aliviar el dolor o las molestias causadas por las hemorroides. Use una crema para las hemorroides si el mdico la autoriza.  Si tiene venas varicosas, use medias de descanso. Eleve los pies durante 15minutos, 3 o 4veces por da. Limite el consumo de sal en su dieta.  Evite levantar objetos pesados, use zapatos de tacones bajos y mantenga una buena postura.  Descanse con las piernas elevadas si tiene   calambres o dolor de cintura.  Visite a su dentista si no lo ha hecho durante el embarazo. Use un cepillo de dientes blando para higienizarse los dientes y psese el hilo dental con suavidad.  Puede seguir manteniendo relaciones sexuales, a menos que el mdico le indique lo contrario.  No haga viajes largos excepto que sea absolutamente necesario y solo con la autorizacin del mdico.  Tome clases prenatales para entender, practicar y hacer preguntas sobre el trabajo de parto y el parto.  Haga un ensayo de la partida al hospital.  Prepare el bolso que llevar al hospital.  Prepare la habitacin del beb.  Concurra a todas  las visitas prenatales segn las indicaciones de su mdico.  SOLICITE ATENCIN MDICA SI:  No est segura de que est en trabajo de parto o de que ha roto la bolsa de las aguas.  Tiene mareos.  Siente clicos leves, presin en la pelvis o dolor persistente en el abdomen.  Tiene nuseas, vmitos o diarrea persistentes.  Observa una secrecin vaginal con mal olor.  Siente dolor al orinar.  SOLICITE ATENCIN MDICA DE INMEDIATO SI:  Tiene fiebre.  Tiene una prdida de lquido por la vagina.  Tiene sangrado o pequeas prdidas vaginales.  Siente dolor intenso o clicos en el abdomen.  Sube o baja de peso rpidamente.  Tiene dificultad para respirar y siente dolor de pecho.  Sbitamente se le hinchan mucho el rostro, las manos, los tobillos, los pies o las piernas.  No ha sentido los movimientos del beb durante una hora.  Siente un dolor de cabeza intenso que no se alivia con medicamentos.  Su visin se modifica.  Esta informacin no tiene como fin reemplazar el consejo del mdico. Asegrese de hacerle al mdico cualquier pregunta que tenga. Document Released: 07/21/2005 Document Revised: 11/01/2014 Document Reviewed: 12/12/2012 Elsevier Interactive Patient Education  2017 [employer]  

## 2017-05-02 ENCOUNTER — Other Ambulatory Visit: Payer: Self-pay

## 2017-05-02 DIAGNOSIS — Z3480 Encounter for supervision of other normal pregnancy, unspecified trimester: Secondary | ICD-10-CM

## 2017-05-03 LAB — GLUCOSE TOLERANCE, 2 HOURS W/ 1HR
GLUCOSE, FASTING: 78 mg/dL (ref 65–91)
Glucose, 1 hour: 153 mg/dL (ref 65–179)
Glucose, 2 hour: 121 mg/dL (ref 65–152)

## 2017-05-03 LAB — CBC
HEMATOCRIT: 35.6 % (ref 34.0–46.6)
HEMOGLOBIN: 11.9 g/dL (ref 11.1–15.9)
MCH: 29.1 pg (ref 26.6–33.0)
MCHC: 33.4 g/dL (ref 31.5–35.7)
MCV: 87 fL (ref 79–97)
Platelets: 324 10*3/uL (ref 150–379)
RBC: 4.09 x10E6/uL (ref 3.77–5.28)
RDW: 15.1 % (ref 12.3–15.4)
WBC: 8.6 10*3/uL (ref 3.4–10.8)

## 2017-05-03 LAB — RPR: RPR: NONREACTIVE

## 2017-05-03 LAB — HIV ANTIBODY (ROUTINE TESTING W REFLEX): HIV Screen 4th Generation wRfx: NONREACTIVE

## 2017-05-04 ENCOUNTER — Other Ambulatory Visit (HOSPITAL_COMMUNITY): Payer: Self-pay | Admitting: *Deleted

## 2017-05-04 ENCOUNTER — Other Ambulatory Visit (HOSPITAL_COMMUNITY): Payer: Self-pay | Admitting: Obstetrics and Gynecology

## 2017-05-04 ENCOUNTER — Ambulatory Visit (HOSPITAL_COMMUNITY)
Admission: RE | Admit: 2017-05-04 | Discharge: 2017-05-04 | Disposition: A | Discharge status: Home / Self Care | Payer: Self-pay | Source: Ambulatory Visit | Attending: Obstetrics & Gynecology | Admitting: Obstetrics & Gynecology

## 2017-05-04 ENCOUNTER — Encounter (HOSPITAL_COMMUNITY): Payer: Self-pay

## 2017-05-04 DIAGNOSIS — O30043 Twin pregnancy, dichorionic/diamniotic, third trimester: Secondary | ICD-10-CM

## 2017-05-04 DIAGNOSIS — Z6831 Body mass index (BMI) 31.0-31.9, adult: Secondary | ICD-10-CM | POA: Insufficient documentation

## 2017-05-04 DIAGNOSIS — O99213 Obesity complicating pregnancy, third trimester: Secondary | ICD-10-CM | POA: Insufficient documentation

## 2017-05-04 DIAGNOSIS — O09523 Supervision of elderly multigravida, third trimester: Secondary | ICD-10-CM | POA: Insufficient documentation

## 2017-05-04 DIAGNOSIS — E669 Obesity, unspecified: Secondary | ICD-10-CM | POA: Insufficient documentation

## 2017-05-04 DIAGNOSIS — Z3A28 28 weeks gestation of pregnancy: Secondary | ICD-10-CM | POA: Insufficient documentation

## 2017-05-04 DIAGNOSIS — O30049 Twin pregnancy, dichorionic/diamniotic, unspecified trimester: Secondary | ICD-10-CM

## 2017-05-18 ENCOUNTER — Ambulatory Visit (INDEPENDENT_AMBULATORY_CARE_PROVIDER_SITE_OTHER): Payer: Self-pay | Admitting: Obstetrics and Gynecology

## 2017-05-18 VITALS — BP 112/59 | HR 86 | Wt 216.9 lb

## 2017-05-18 DIAGNOSIS — O09523 Supervision of elderly multigravida, third trimester: Secondary | ICD-10-CM

## 2017-05-18 DIAGNOSIS — Z789 Other specified health status: Secondary | ICD-10-CM

## 2017-05-18 DIAGNOSIS — O30049 Twin pregnancy, dichorionic/diamniotic, unspecified trimester: Secondary | ICD-10-CM

## 2017-05-18 DIAGNOSIS — O0993 Supervision of high risk pregnancy, unspecified, third trimester: Secondary | ICD-10-CM

## 2017-05-18 DIAGNOSIS — O30043 Twin pregnancy, dichorionic/diamniotic, third trimester: Secondary | ICD-10-CM

## 2017-05-18 DIAGNOSIS — Z23 Encounter for immunization: Secondary | ICD-10-CM

## 2017-05-18 DIAGNOSIS — I83893 Varicose veins of bilateral lower extremities with other complications: Secondary | ICD-10-CM

## 2017-05-18 DIAGNOSIS — R32 Unspecified urinary incontinence: Secondary | ICD-10-CM

## 2017-05-18 DIAGNOSIS — O0990 Supervision of high risk pregnancy, unspecified, unspecified trimester: Secondary | ICD-10-CM

## 2017-05-18 DIAGNOSIS — O09529 Supervision of elderly multigravida, unspecified trimester: Secondary | ICD-10-CM

## 2017-05-18 LAB — POCT URINALYSIS DIP (DEVICE)
Bilirubin Urine: NEGATIVE
GLUCOSE, UA: NEGATIVE mg/dL
Nitrite: NEGATIVE
Protein, ur: 30 mg/dL — AB
SPECIFIC GRAVITY, URINE: 1.02 (ref 1.005–1.030)
Urobilinogen, UA: 0.2 mg/dL (ref 0.0–1.0)
pH: 6 (ref 5.0–8.0)

## 2017-05-18 NOTE — Progress Notes (Signed)
Prenatal Visit Note Date: 05/18/2017 Clinic: [facility]  Subjective:  Roberta Marshall is a 41 y.o. [phone] at 7376w4d being seen today for ongoing prenatal care.  She is currently monitored for the following issues for this high-risk pregnancy and has Maternal age 39+, multigravida, antepartum; Dichorionic diamniotic twin pregnancy, antepartum; Supervision of high risk pregnancy, antepartum; Rubella non-immune status, antepartum; Varicose veins of lower extremity with edema, bilateral; Language barrier; and Urinary incontinence on her problem list.  Patient reports leakage of urine when babies move. She states it's been going on since about 20wks. No blood. States it happens 2-3x/day and wears a pad. No fevers, chills, low belly pain Contractions: Not present. Vag. Bleeding: None.  Movement: Present. Denies leaking of fluid.   The following portions of the patient's history were reviewed and updated as appropriate: allergies, current medications, past family history, past medical history, past social history, past surgical history and problem list. Problem list updated.  Objective:   Vitals:   05/18/17 1320  BP: (!) 112/59  Pulse: 86  Weight: 216 lb 14.4 oz (98.4 kg)    Fetal Status: Fetal Heart Rate (bpm): 138/134   Movement: Present     General:  Alert, oriented and cooperative. Patient is in no acute distress.  Skin: Skin is warm and dry. No rash noted.   Cardiovascular: Normal heart rate noted  Respiratory: Normal respiratory effort, no problems with respiration noted  Abdomen: Soft, gravid, appropriate for gestational age. Pain/Pressure: Present     Pelvic:  Cervical exam performed        EGBUS normal. Vaginal vault: normal, no d/c or fluid. Cervix: visually closed, negative cough test. Slight smell of urine.   Extremities: Normal range of motion.  Edema: Mild pitting, slight indentation  Mental Status: Normal mood and affect. Normal behavior. Normal  judgment and thought content.   Urinalysis: Urine Protein: 1+ Urine Glucose: Negative. +leuks  Assessment and Plan:  Pregnancy: G4P3003 at 7676w4d  1. Supervision of high risk pregnancy, antepartum Routine care.  - Tdap vaccine greater than or equal to 7yo IM - Culture, OB Urine  2. Maternal age 39+, multigravida, antepartum No issues - Tdap vaccine greater than or equal to 7yo IM - Culture, OB Urine  3. Dichorionic diamniotic twin pregnancy, antepartum 7/11: A trans nl mvp, efw 57% and normal ac B trans, nl mvp efw 50%, and normal ac Pt desires VD. D/w her reasons for c/s - Tdap vaccine greater than or equal to 7yo IM - Culture, OB Urine  4. Language barrier Interpreter used  5. Varicose veins of lower extremity with edema, bilateral Wearing hoses and stable - Culture, OB Urine  6. Urinary incontinence, unspecified type Ucx. D/w pt to empty bladder q7474m and hopefully should improve after pregnancy.  - Culture, OB Urine  Preterm labor symptoms and general obstetric precautions including but not limited to vaginal bleeding, contractions, leaking of fluid and fetal movement were reviewed in detail with the patient. Please refer to After Visit Summary for other counseling recommendations.  Return in about 2 weeks (around 06/01/2017).   Weldon BingPickens, Avaeh Ewer, MD

## 2017-05-18 NOTE — Progress Notes (Signed)
Patient reports constantly leaking urine

## 2017-05-20 LAB — CULTURE, OB URINE

## 2017-05-20 LAB — URINE CULTURE, OB REFLEX

## 2017-06-01 ENCOUNTER — Ambulatory Visit (HOSPITAL_COMMUNITY)
Admission: RE | Admit: 2017-06-01 | Discharge: 2017-06-01 | Disposition: A | Discharge status: Home / Self Care | Payer: Self-pay | Source: Ambulatory Visit | Attending: Obstetrics & Gynecology | Admitting: Obstetrics & Gynecology

## 2017-06-01 ENCOUNTER — Other Ambulatory Visit (HOSPITAL_COMMUNITY): Payer: Self-pay | Admitting: *Deleted

## 2017-06-01 ENCOUNTER — Encounter (HOSPITAL_COMMUNITY): Payer: Self-pay

## 2017-06-01 ENCOUNTER — Ambulatory Visit (INDEPENDENT_AMBULATORY_CARE_PROVIDER_SITE_OTHER): Payer: Self-pay | Admitting: Obstetrics and Gynecology

## 2017-06-01 VITALS — BP 118/70 | HR 84 | Wt 219.7 lb

## 2017-06-01 DIAGNOSIS — O30043 Twin pregnancy, dichorionic/diamniotic, third trimester: Secondary | ICD-10-CM

## 2017-06-01 DIAGNOSIS — O09523 Supervision of elderly multigravida, third trimester: Secondary | ICD-10-CM

## 2017-06-01 DIAGNOSIS — O99213 Obesity complicating pregnancy, third trimester: Secondary | ICD-10-CM | POA: Insufficient documentation

## 2017-06-01 DIAGNOSIS — O30049 Twin pregnancy, dichorionic/diamniotic, unspecified trimester: Secondary | ICD-10-CM

## 2017-06-01 DIAGNOSIS — Z3A32 32 weeks gestation of pregnancy: Secondary | ICD-10-CM | POA: Insufficient documentation

## 2017-06-01 DIAGNOSIS — O0990 Supervision of high risk pregnancy, unspecified, unspecified trimester: Secondary | ICD-10-CM

## 2017-06-01 DIAGNOSIS — O0993 Supervision of high risk pregnancy, unspecified, third trimester: Secondary | ICD-10-CM

## 2017-06-01 DIAGNOSIS — Z789 Other specified health status: Secondary | ICD-10-CM

## 2017-06-01 DIAGNOSIS — O09529 Supervision of elderly multigravida, unspecified trimester: Secondary | ICD-10-CM

## 2017-06-01 NOTE — Progress Notes (Signed)
Spanish interpreter "Donald PoreJorge" [phone]#[id number] used for visit

## 2017-06-01 NOTE — Progress Notes (Signed)
Prenatal Visit Note Date: 06/01/2017 Clinic: [facility]  Subjective:  Roberta Marshall is a 41 y.o. [phone] at 6059w4d being seen today for ongoing prenatal care.  She is currently monitored for the following issues for this high-risk pregnancy and has Maternal age 39+, multigravida, antepartum; Dichorionic diamniotic twin pregnancy, antepartum; Supervision of high risk pregnancy, antepartum; Rubella non-immune status, antepartum; Varicose veins of lower extremity with edema, bilateral; Language barrier; and Urinary incontinence on her problem list.  Patient reports no complaints.   Contractions: Irritability. Vag. Bleeding: None.  Movement: Present. Denies leaking of fluid.   The following portions of the patient's history were reviewed and updated as appropriate: allergies, current medications, past family history, past medical history, past social history, past surgical history and problem list. Problem list updated.  Objective:   Vitals:   06/01/17 0803  BP: 118/70  Pulse: 84  Weight: 219 lb 11.2 oz (99.7 kg)    Fetal Status: Fetal Heart Rate (bpm): 140/137   Movement: Present     General:  Alert, oriented and cooperative. Patient is in no acute distress.  Skin: Skin is warm and dry. No rash noted.   Cardiovascular: Normal heart rate noted  Respiratory: Normal respiratory effort, no problems with respiration noted  Abdomen: Soft, gravid, appropriate for gestational age. Pain/Pressure: Present     Pelvic:  Cervical exam deferred        Extremities: Normal range of motion.  Edema: Moderate pitting, indentation subsides rapidly  Mental Status: Normal mood and affect. Normal behavior. Normal judgment and thought content.   Urinalysis:      Assessment and Plan:  Pregnancy: G4P3003 at 3159w4d  1. Dichorionic diamniotic twin pregnancy, antepartum Routine care. Has surveillance growth u/s today. Fetus A was cephalic last time and efws were discordant. Pt desires  VD and d/w her why she would need a c/s, although it is fine if she wanted one electively with twins. Can d/w pt more nv. Start qwk bpps at 35wsk - US MFM FETAL BPP W/NONSTRESS; Future - US MFM FETAL BPP W/NONSTRESS ADD'L GEST; Future  2. Supervision of high risk pregnancy, antepartum Routine care. Pt desires BTL if for c-section. Pt aware it's an extra charge LE edema is stable  - US MFM FETAL BPP W/NONSTRESS; Future - US MFM FETAL BPP W/NONSTRESS ADD'L GEST; Future  3. Language barrier interepreter used  4. Maternal age 39+, multigravida, antepartum No issues - US MFM FETAL BPP W/NONSTRESS; Future - US MFM FETAL BPP W/NONSTRESS ADD'L GEST; Future  Preterm labor symptoms and general obstetric precautions including but not limited to vaginal bleeding, contractions, leaking of fluid and fetal movement were reviewed in detail with the patient. Please refer to After Visit Summary for other counseling recommendations.  Return in about 2 weeks (around 06/15/2017) for rob.   Malta BingPickens, Jesi Jurgens, MD

## 2017-06-15 ENCOUNTER — Other Ambulatory Visit: Payer: Self-pay | Admitting: Obstetrics and Gynecology

## 2017-06-15 ENCOUNTER — Encounter (HOSPITAL_COMMUNITY): Payer: Self-pay

## 2017-06-15 ENCOUNTER — Ambulatory Visit (HOSPITAL_COMMUNITY)
Admission: RE | Admit: 2017-06-15 | Discharge: 2017-06-15 | Disposition: A | Discharge status: Home / Self Care | Payer: Self-pay | Source: Ambulatory Visit | Attending: Obstetrics and Gynecology | Admitting: Obstetrics and Gynecology

## 2017-06-15 DIAGNOSIS — O0993 Supervision of high risk pregnancy, unspecified, third trimester: Secondary | ICD-10-CM | POA: Insufficient documentation

## 2017-06-15 DIAGNOSIS — Z3A34 34 weeks gestation of pregnancy: Secondary | ICD-10-CM

## 2017-06-15 DIAGNOSIS — O09529 Supervision of elderly multigravida, unspecified trimester: Secondary | ICD-10-CM

## 2017-06-15 DIAGNOSIS — O0990 Supervision of high risk pregnancy, unspecified, unspecified trimester: Secondary | ICD-10-CM

## 2017-06-15 DIAGNOSIS — O30049 Twin pregnancy, dichorionic/diamniotic, unspecified trimester: Secondary | ICD-10-CM

## 2017-06-15 DIAGNOSIS — O09523 Supervision of elderly multigravida, third trimester: Secondary | ICD-10-CM | POA: Insufficient documentation

## 2017-06-15 DIAGNOSIS — O99213 Obesity complicating pregnancy, third trimester: Secondary | ICD-10-CM | POA: Insufficient documentation

## 2017-06-15 DIAGNOSIS — O30043 Twin pregnancy, dichorionic/diamniotic, third trimester: Secondary | ICD-10-CM | POA: Insufficient documentation

## 2017-06-16 ENCOUNTER — Encounter (HOSPITAL_COMMUNITY): Payer: Self-pay

## 2017-06-16 ENCOUNTER — Ambulatory Visit (INDEPENDENT_AMBULATORY_CARE_PROVIDER_SITE_OTHER): Payer: Self-pay | Admitting: Obstetrics & Gynecology

## 2017-06-16 VITALS — BP 126/72 | HR 70 | Wt 225.9 lb

## 2017-06-16 DIAGNOSIS — O30049 Twin pregnancy, dichorionic/diamniotic, unspecified trimester: Secondary | ICD-10-CM

## 2017-06-16 DIAGNOSIS — O30043 Twin pregnancy, dichorionic/diamniotic, third trimester: Secondary | ICD-10-CM

## 2017-06-16 DIAGNOSIS — I83893 Varicose veins of bilateral lower extremities with other complications: Secondary | ICD-10-CM

## 2017-06-16 DIAGNOSIS — Z01818 Encounter for other preprocedural examination: Secondary | ICD-10-CM

## 2017-06-16 DIAGNOSIS — O0993 Supervision of high risk pregnancy, unspecified, third trimester: Secondary | ICD-10-CM

## 2017-06-16 DIAGNOSIS — O0990 Supervision of high risk pregnancy, unspecified, unspecified trimester: Secondary | ICD-10-CM

## 2017-06-16 NOTE — Progress Notes (Signed)
Spanish interpreter Okey Regal present for visit   PRENATAL VISIT NOTE  Subjective:  Roberta Marshall is a 41 y.o. [phone] at [redacted]w[redacted]d being seen today for ongoing prenatal care.  She is currently monitored for the following issues for this high-risk pregnancy and has Maternal age 17+, multigravida, antepartum; Tubal ligation evaluation; Dichorionic diamniotic twin pregnancy, antepartum; Supervision of high risk pregnancy, antepartum; Rubella non-immune status, antepartum; Varicose veins of lower extremity with edema, bilateral; Language barrier; and Urinary incontinence on her problem list.  Patient reports feet swelling.  Contractions: Irritability. Vag. Bleeding: None.  Movement: Present. Denies leaking of fluid.   The following portions of the patient's history were reviewed and updated as appropriate: allergies, current medications, past family history, past medical history, past social history, past surgical history and problem list. Problem list updated.  Objective:   Vitals:   06/16/17 0804  BP: 126/72  Pulse: 70  Weight: 225 lb 14.4 oz (102.5 kg)    Fetal Status: Fetal Heart Rate (bpm): ?/123   Movement: Present     General:  Alert, oriented and cooperative. Patient is in no acute distress.  Skin: Skin is warm and dry. No rash noted.   Cardiovascular: Normal heart rate noted  Respiratory: Normal respiratory effort, no problems with respiration noted  Abdomen: Soft, gravid, appropriate for gestational age.  Pain/Pressure: Present     Pelvic: Cervical exam deferred        Extremities: Normal range of motion.  Edema: Deep pitting, indentation remains for a short time  Mental Status:  Normal mood and affect. Normal behavior. Normal judgment and thought content.   Assessment and Plan:  Pregnancy: G4P3003 at [redacted]w[redacted]d  1. Tubal ligation evaluation Will get BTL at time of c/s  2. Varicose veins of lower extremity with edema, bilateral Not using ted hose.  Encouraged to elevate  extremities and place first thing in a.  No evidence of DVT.  3. Dichorionic diamniotic twin pregnancy, antepartum Antenatal testing with Diane.  Growth [place] as per testing scheduled. Growth next is 06/22/17   Preterm labor symptoms and general obstetric precautions including but not limited to vaginal bleeding, contractions, leaking of fluid and fetal movement were reviewed in detail with the patient. Please refer to After Visit Summary for other counseling recommendations.  Return in about 1 week (around 06/23/2017).   Elsie Lincoln, MD

## 2017-06-22 ENCOUNTER — Ambulatory Visit (HOSPITAL_COMMUNITY)
Admission: RE | Admit: 2017-06-22 | Discharge: 2017-06-22 | Disposition: A | Discharge status: Home / Self Care | Payer: Self-pay | Source: Ambulatory Visit | Attending: Obstetrics & Gynecology | Admitting: Obstetrics & Gynecology

## 2017-06-22 ENCOUNTER — Encounter (HOSPITAL_COMMUNITY): Payer: Self-pay

## 2017-06-22 ENCOUNTER — Ambulatory Visit (INDEPENDENT_AMBULATORY_CARE_PROVIDER_SITE_OTHER): Payer: Self-pay | Admitting: Obstetrics & Gynecology

## 2017-06-22 VITALS — BP 130/83 | HR 81 | Wt 229.6 lb

## 2017-06-22 DIAGNOSIS — O09529 Supervision of elderly multigravida, unspecified trimester: Secondary | ICD-10-CM

## 2017-06-22 DIAGNOSIS — O30043 Twin pregnancy, dichorionic/diamniotic, third trimester: Secondary | ICD-10-CM | POA: Insufficient documentation

## 2017-06-22 DIAGNOSIS — O09523 Supervision of elderly multigravida, third trimester: Secondary | ICD-10-CM | POA: Insufficient documentation

## 2017-06-22 DIAGNOSIS — Z3A35 35 weeks gestation of pregnancy: Secondary | ICD-10-CM | POA: Insufficient documentation

## 2017-06-22 DIAGNOSIS — Z6831 Body mass index (BMI) 31.0-31.9, adult: Secondary | ICD-10-CM | POA: Insufficient documentation

## 2017-06-22 DIAGNOSIS — O99213 Obesity complicating pregnancy, third trimester: Secondary | ICD-10-CM | POA: Insufficient documentation

## 2017-06-22 DIAGNOSIS — O30049 Twin pregnancy, dichorionic/diamniotic, unspecified trimester: Secondary | ICD-10-CM

## 2017-06-22 DIAGNOSIS — O0990 Supervision of high risk pregnancy, unspecified, unspecified trimester: Secondary | ICD-10-CM

## 2017-06-22 DIAGNOSIS — O0993 Supervision of high risk pregnancy, unspecified, third trimester: Secondary | ICD-10-CM

## 2017-06-22 NOTE — Patient Instructions (Signed)
Regrese a la clinica cuando tenga su cita. Si tiene problemas o preguntas, llama a la clinica o vaya a la sala de emergencia al [facility].    

## 2017-06-22 NOTE — Progress Notes (Signed)
   PRENATAL VISIT NOTE  Subjective:  Roberta Marshall is a 41 y.o. [phone] at [redacted]w[redacted]d being seen today for ongoing prenatal care. Patient is Spanish-speaking only, Spanish interpreter present for this encounter.  She is currently monitored for the following issues for this high-risk pregnancy and has Maternal age 57+, multigravida, antepartum; Request for sterilization; Dichorionic diamniotic twin pregnancy, antepartum; Supervision of high risk pregnancy, antepartum; Rubella non-immune status, antepartum; Varicose veins of lower extremity with edema, bilateral; Language barrier; and Urinary incontinence on her problem list.  Patient reports no complaints.  Contractions: Irritability. Vag. Bleeding: None.  Movement: Present. Denies leaking of fluid.   The following portions of the patient's history were reviewed and updated as appropriate: allergies, current medications, past family history, past medical history, past social history, past surgical history and problem list. Problem list updated.  Objective:   Vitals:   06/22/17 0757 06/22/17 0759  BP: (!) 111/97 130/83  Pulse: 78 81  Weight: 229 lb 9.6 oz (104.1 kg)     Fetal Status: Fetal Heart Rate (bpm): 130/148   Movement: Present     General:  Alert, oriented and cooperative. Patient is in no acute distress.  Skin: Skin is warm and dry. No rash noted.   Cardiovascular: Normal heart rate noted  Respiratory: Normal respiratory effort, no problems with respiration noted  Abdomen: Soft, gravid, appropriate for gestational age.  Pain/Pressure: Absent     Pelvic: Cervical exam deferred        Extremities: Normal range of motion.  Edema: Deep pitting, indentation remains for a short time  Mental Status:  Normal mood and affect. Normal behavior. Normal judgment and thought content.   Assessment and Plan:  Pregnancy: G4P3003 at [redacted]w[redacted]d  1. Dichorionic diamniotic twin pregnancy, antepartum Growth scan today, BPP added on. Will need weekly  BPP until scheduled cesarean section and BTS on 07/10/17 - [place] MFM FETAL BPP W/NONSTRESS; Future - [place] MFM FETAL BPP W/NONSTRESS ADD'L GEST; Future - [place] MFM FETAL BPP W/NONSTRESS; Future - [place] MFM FETAL BPP W/NONSTRESS ADD'L GEST; Future - [place] MFM FETAL BPP W/NONSTRESS; Future - [place] MFM FETAL BPP W/NONSTRESS ADD'L GEST; Future  2. Maternal age 78+, multigravida, antepartum 3. Supervision of high risk pregnancy, antepartum Preterm labor symptoms and general obstetric precautions including but not limited to vaginal bleeding, contractions, leaking of fluid and fetal movement were reviewed in detail with the patient. Please refer to After Visit Summary for other counseling recommendations.  Return in about 1 week (around 06/29/2017) for OB Visit [facility]), Pelvic cultures.   Jaynie Collins, MD

## 2017-06-22 NOTE — Addendum Note (Signed)
Encounter addended by: Joycelyn RuaMurrow, Kennett Symes Mae on: 06/22/2017  9:50 AM<BR>    Actions taken: Imaging Exam ended

## 2017-06-24 ENCOUNTER — Telehealth (HOSPITAL_COMMUNITY): Payer: Self-pay | Admitting: *Deleted

## 2017-06-24 NOTE — Telephone Encounter (Signed)
Preadmission screen  

## 2017-06-24 NOTE — Pre-Procedure Instructions (Signed)
Interpreter number [phone]

## 2017-06-28 ENCOUNTER — Other Ambulatory Visit: Payer: Self-pay | Admitting: Obstetrics & Gynecology

## 2017-06-28 DIAGNOSIS — Z3A36 36 weeks gestation of pregnancy: Secondary | ICD-10-CM

## 2017-06-28 DIAGNOSIS — O30049 Twin pregnancy, dichorionic/diamniotic, unspecified trimester: Secondary | ICD-10-CM

## 2017-06-29 ENCOUNTER — Ambulatory Visit (HOSPITAL_COMMUNITY)
Admission: RE | Admit: 2017-06-29 | Discharge: 2017-06-29 | Disposition: A | Discharge status: Home / Self Care | Payer: Self-pay | Source: Ambulatory Visit | Attending: Obstetrics & Gynecology | Admitting: Obstetrics & Gynecology

## 2017-06-29 ENCOUNTER — Encounter (HOSPITAL_COMMUNITY): Payer: Self-pay

## 2017-06-29 DIAGNOSIS — Z3A36 36 weeks gestation of pregnancy: Secondary | ICD-10-CM | POA: Insufficient documentation

## 2017-06-29 DIAGNOSIS — O30049 Twin pregnancy, dichorionic/diamniotic, unspecified trimester: Secondary | ICD-10-CM | POA: Insufficient documentation

## 2017-07-03 ENCOUNTER — Encounter (HOSPITAL_COMMUNITY)
Admission: AD | Disposition: A | Discharge status: Home / Self Care | Payer: Self-pay | Source: Ambulatory Visit | Attending: Obstetrics and Gynecology

## 2017-07-03 ENCOUNTER — Inpatient Hospital Stay (HOSPITAL_COMMUNITY): Payer: Medicaid Other | Admitting: Anesthesiology

## 2017-07-03 ENCOUNTER — Inpatient Hospital Stay (HOSPITAL_COMMUNITY)
Admission: AD | Admit: 2017-07-03 | Discharge: 2017-07-06 | DRG: 765 | Disposition: A | Discharge status: Home / Self Care | Payer: Medicaid Other | Source: Ambulatory Visit | Attending: Obstetrics and Gynecology | Admitting: Obstetrics and Gynecology

## 2017-07-03 ENCOUNTER — Encounter (HOSPITAL_COMMUNITY): Payer: Self-pay | Admitting: Anesthesiology

## 2017-07-03 DIAGNOSIS — O30043 Twin pregnancy, dichorionic/diamniotic, third trimester: Secondary | ICD-10-CM | POA: Diagnosis present

## 2017-07-03 DIAGNOSIS — O1414 Severe pre-eclampsia complicating childbirth: Secondary | ICD-10-CM | POA: Diagnosis present

## 2017-07-03 DIAGNOSIS — O321XX1 Maternal care for breech presentation, fetus 1: Secondary | ICD-10-CM

## 2017-07-03 DIAGNOSIS — O1410 Severe pre-eclampsia, unspecified trimester: Secondary | ICD-10-CM | POA: Diagnosis present

## 2017-07-03 DIAGNOSIS — Z3A37 37 weeks gestation of pregnancy: Secondary | ICD-10-CM | POA: Diagnosis not present

## 2017-07-03 DIAGNOSIS — O134 Gestational [pregnancy-induced] hypertension without significant proteinuria, complicating childbirth: Secondary | ICD-10-CM

## 2017-07-03 DIAGNOSIS — Z302 Encounter for sterilization: Secondary | ICD-10-CM

## 2017-07-03 DIAGNOSIS — O09529 Supervision of elderly multigravida, unspecified trimester: Secondary | ICD-10-CM

## 2017-07-03 DIAGNOSIS — Z98891 History of uterine scar from previous surgery: Secondary | ICD-10-CM

## 2017-07-03 DIAGNOSIS — O4292 Full-term premature rupture of membranes, unspecified as to length of time between rupture and onset of labor: Secondary | ICD-10-CM | POA: Diagnosis present

## 2017-07-03 DIAGNOSIS — O30049 Twin pregnancy, dichorionic/diamniotic, unspecified trimester: Secondary | ICD-10-CM | POA: Diagnosis present

## 2017-07-03 DIAGNOSIS — IMO0002 Reserved for concepts with insufficient information to code with codable children: Secondary | ICD-10-CM

## 2017-07-03 DIAGNOSIS — O321XX2 Maternal care for breech presentation, fetus 2: Secondary | ICD-10-CM

## 2017-07-03 HISTORY — DX: Varicose veins of bilateral lower extremities with other complications: I83.893

## 2017-07-03 LAB — CBC
HCT: 38.3 % (ref 36.0–46.0)
HCT: 41.5 % (ref 36.0–46.0)
HEMOGLOBIN: 13.6 g/dL (ref 12.0–15.0)
HEMOGLOBIN: 14.5 g/dL (ref 12.0–15.0)
MCH: 30.5 pg (ref 26.0–34.0)
MCH: 30.1 pg (ref 26.0–34.0)
MCHC: 35.5 g/dL (ref 30.0–36.0)
MCHC: 34.9 g/dL (ref 30.0–36.0)
MCV: 85.9 fL (ref 78.0–100.0)
MCV: 86.3 fL (ref 78.0–100.0)
PLATELETS: 249 10*3/uL (ref 150–400)
Platelets: 240 10*3/uL (ref 150–400)
RBC: 4.46 MIL/uL (ref 3.87–5.11)
RBC: 4.81 MIL/uL (ref 3.87–5.11)
RDW: 15.4 % (ref 11.5–15.5)
RDW: 15.3 % (ref 11.5–15.5)
WBC: 7.5 10*3/uL (ref 4.0–10.5)
WBC: 16.1 10*3/uL — ABNORMAL HIGH (ref 4.0–10.5)

## 2017-07-03 LAB — COMPREHENSIVE METABOLIC PANEL
ALBUMIN: 2 g/dL — AB (ref 3.5–5.0)
ALBUMIN: 2 g/dL — AB (ref 3.5–5.0)
ALT: 15 U/L (ref 14–54)
ALT: 16 U/L (ref 14–54)
ANION GAP: 7 (ref 5–15)
AST: 26 U/L (ref 15–41)
AST: 32 U/L (ref 15–41)
Alkaline Phosphatase: 195 U/L — ABNORMAL HIGH (ref 38–126)
Alkaline Phosphatase: 190 U/L — ABNORMAL HIGH (ref 38–126)
Anion gap: 7 (ref 5–15)
BILIRUBIN TOTAL: 0.4 mg/dL (ref 0.3–1.2)
BUN: 16 mg/dL (ref 6–20)
BUN: 14 mg/dL (ref 6–20)
CHLORIDE: 106 mmol/L (ref 101–111)
CO2: 20 mmol/L — ABNORMAL LOW (ref 22–32)
CO2: 21 mmol/L — AB (ref 22–32)
CREATININE: 0.59 mg/dL (ref 0.44–1.00)
Calcium: 8.4 mg/dL — ABNORMAL LOW (ref 8.9–10.3)
Calcium: 8.3 mg/dL — ABNORMAL LOW (ref 8.9–10.3)
Chloride: 107 mmol/L (ref 101–111)
Creatinine, Ser: 0.69 mg/dL (ref 0.44–1.00)
GFR calc Af Amer: >60 mL/min (ref >60)
GFR calc non Af Amer: >60 mL/min (ref >60)
GFR calc non Af Amer: >60 mL/min (ref >60)
GLUCOSE: 79 mg/dL (ref 65–99)
GLUCOSE: 106 mg/dL — AB (ref 65–99)
POTASSIUM: 4.5 mmol/L (ref 3.5–5.1)
Potassium: 4.6 mmol/L (ref 3.5–5.1)
SODIUM: 134 mmol/L — AB (ref 135–145)
Sodium: 134 mmol/L — ABNORMAL LOW (ref 135–145)
TOTAL PROTEIN: 5.3 g/dL — AB (ref 6.5–8.1)
Total Bilirubin: 0.5 mg/dL (ref 0.3–1.2)
Total Protein: 5.4 g/dL — ABNORMAL LOW (ref 6.5–8.1)

## 2017-07-03 LAB — TYPE AND SCREEN
ABO/RH(D): O POS
ANTIBODY SCREEN: NEGATIVE

## 2017-07-03 LAB — POCT FERN TEST

## 2017-07-03 SURGERY — Surgical Case
Anesthesia: Spinal | Site: Abdomen | Wound class: Clean Contaminated

## 2017-07-03 MED ORDER — HYDRALAZINE HCL 20 MG/ML IJ SOLN
10.0000 mg | Freq: Once | INTRAMUSCULAR | Status: AC
  Administered 2017-07-03: 10 mg via INTRAVENOUS

## 2017-07-03 MED ORDER — ZOLPIDEM TARTRATE 5 MG PO TABS: 5.0000 mg | ORAL_TABLET | Freq: Every evening | ORAL | Status: DC | PRN

## 2017-07-03 MED ORDER — PHENYLEPHRINE 8 MG IN D5W 100 ML (0.08MG/ML) PREMIX OPTIME
INJECTION | INTRAVENOUS | Status: DC | PRN
  Administered 2017-07-03: 60 ug/min via INTRAVENOUS

## 2017-07-03 MED ORDER — ONDANSETRON HCL 4 MG/2ML IJ SOLN
INTRAMUSCULAR | Status: AC
  Filled 2017-07-03: qty 2

## 2017-07-03 MED ORDER — IBUPROFEN 600 MG PO TABS
600.0000 mg | ORAL_TABLET | Freq: Four times a day (QID) | ORAL | Status: DC | PRN
  Administered 2017-07-04 – 2017-07-06 (×5): 600 mg via ORAL
  Filled 2017-07-03 (×5): qty 1

## 2017-07-03 MED ORDER — LACTATED RINGERS IV SOLN
INTRAVENOUS | Status: DC
  Administered 2017-07-03: 12:00:00 via INTRAVENOUS

## 2017-07-03 MED ORDER — FERROUS SULFATE 325 (65 FE) MG PO TABS
325.0000 mg | ORAL_TABLET | Freq: Two times a day (BID) | ORAL | Status: DC
  Administered 2017-07-04 – 2017-07-06 (×5): 325 mg via ORAL
  Filled 2017-07-03 (×5): qty 1

## 2017-07-03 MED ORDER — SCOPOLAMINE 1 MG/3DAYS TD PT72
MEDICATED_PATCH | TRANSDERMAL | Status: DC | PRN
  Administered 2017-07-03: 1 via TRANSDERMAL

## 2017-07-03 MED ORDER — METOCLOPRAMIDE HCL 5 MG/ML IJ SOLN
10.0000 mg | Freq: Once | INTRAMUSCULAR | Status: AC | PRN
  Administered 2017-07-03: 10 mg via INTRAVENOUS

## 2017-07-03 MED ORDER — FENTANYL CITRATE (PF) 100 MCG/2ML IJ SOLN
INTRAMUSCULAR | Status: AC
  Filled 2017-07-03: qty 2

## 2017-07-03 MED ORDER — COCONUT OIL OIL: 1.0000 "application " | TOPICAL_OIL | Status: DC | PRN

## 2017-07-03 MED ORDER — BUPIVACAINE IN DEXTROSE 0.75-8.25 % IT SOLN
INTRATHECAL | Status: DC | PRN
  Administered 2017-07-03: 1.4 mL via INTRATHECAL

## 2017-07-03 MED ORDER — METHYLERGONOVINE MALEATE 0.2 MG/ML IJ SOLN: 0.2000 mg | Freq: Once | INTRAMUSCULAR | Status: AC

## 2017-07-03 MED ORDER — PRENATAL MULTIVITAMIN CH
1.0000 | ORAL_TABLET | Freq: Every day | ORAL | Status: DC
  Administered 2017-07-04 – 2017-07-06 (×3): 1 via ORAL
  Filled 2017-07-03 (×3): qty 1

## 2017-07-03 MED ORDER — OXYTOCIN 10 UNIT/ML IJ SOLN
INTRAVENOUS | Status: DC | PRN
  Administered 2017-07-03: 40 [IU] via INTRAVENOUS

## 2017-07-03 MED ORDER — OXYCODONE HCL 5 MG PO TABS: 10.0000 mg | ORAL_TABLET | ORAL | Status: DC | PRN

## 2017-07-03 MED ORDER — ONDANSETRON HCL 4 MG/2ML IJ SOLN
INTRAMUSCULAR | Status: DC | PRN
  Administered 2017-07-03: 4 mg via INTRAVENOUS

## 2017-07-03 MED ORDER — PROMETHAZINE HCL 25 MG/ML IJ SOLN
12.5000 mg | Freq: Once | INTRAMUSCULAR | Status: AC
  Administered 2017-07-03: 12.5 mg via INTRAVENOUS

## 2017-07-03 MED ORDER — SIMETHICONE 80 MG PO CHEW
80.0000 mg | CHEWABLE_TABLET | Freq: Three times a day (TID) | ORAL | Status: DC
  Administered 2017-07-05 – 2017-07-06 (×3): 80 mg via ORAL
  Filled 2017-07-03 (×7): qty 1

## 2017-07-03 MED ORDER — SENNOSIDES-DOCUSATE SODIUM 8.6-50 MG PO TABS: 2.0000 | ORAL_TABLET | Freq: Every evening | ORAL | Status: DC | PRN

## 2017-07-03 MED ORDER — OXYTOCIN 10 UNIT/ML IJ SOLN
INTRAMUSCULAR | Status: AC
  Filled 2017-07-03: qty 4

## 2017-07-03 MED ORDER — SOD CITRATE-CITRIC ACID 500-334 MG/5ML PO SOLN
30.0000 mL | Freq: Once | ORAL | Status: AC
  Administered 2017-07-03: 30 mL via ORAL
  Filled 2017-07-03: qty 15

## 2017-07-03 MED ORDER — LACTATED RINGERS IV SOLN
INTRAVENOUS | Status: DC
  Administered 2017-07-03: 19:00:00 via INTRAVENOUS

## 2017-07-03 MED ORDER — OXYTOCIN 40 UNITS IN LACTATED RINGERS INFUSION - SIMPLE MED: 2.5000 [IU]/h | INTRAVENOUS | Status: AC

## 2017-07-03 MED ORDER — NALOXONE HCL 0.4 MG/ML IJ SOLN: 0.4000 mg | INTRAMUSCULAR | Status: DC | PRN

## 2017-07-03 MED ORDER — METOCLOPRAMIDE HCL 5 MG/ML IJ SOLN
INTRAMUSCULAR | Status: AC
  Filled 2017-07-03: qty 2

## 2017-07-03 MED ORDER — TETANUS-DIPHTH-ACELL PERTUSSIS 5-2.5-18.5 LF-MCG/0.5 IM SUSP: 0.5000 mL | Freq: Once | INTRAMUSCULAR | Status: DC

## 2017-07-03 MED ORDER — OXYCODONE HCL 5 MG PO TABS: 5.0000 mg | ORAL_TABLET | ORAL | Status: DC | PRN

## 2017-07-03 MED ORDER — NALBUPHINE HCL 10 MG/ML IJ SOLN: 5.0000 mg | INTRAMUSCULAR | Status: DC | PRN

## 2017-07-03 MED ORDER — KETOROLAC TROMETHAMINE 30 MG/ML IJ SOLN
30.0000 mg | Freq: Four times a day (QID) | INTRAMUSCULAR | Status: AC | PRN
  Administered 2017-07-04: 30 mg via INTRAVENOUS
  Filled 2017-07-03: qty 1

## 2017-07-03 MED ORDER — DEXAMETHASONE SODIUM PHOSPHATE 10 MG/ML IJ SOLN
INTRAMUSCULAR | Status: AC
  Filled 2017-07-03: qty 1

## 2017-07-03 MED ORDER — KETOROLAC TROMETHAMINE 30 MG/ML IJ SOLN: 30.0000 mg | Freq: Four times a day (QID) | INTRAMUSCULAR | Status: AC | PRN

## 2017-07-03 MED ORDER — DEXAMETHASONE SODIUM PHOSPHATE 10 MG/ML IJ SOLN
INTRAMUSCULAR | Status: DC | PRN
  Administered 2017-07-03: 10 mg via INTRAVENOUS

## 2017-07-03 MED ORDER — FENTANYL CITRATE (PF) 100 MCG/2ML IJ SOLN
INTRAMUSCULAR | Status: DC | PRN
  Administered 2017-07-03: 20 ug via INTRATHECAL

## 2017-07-03 MED ORDER — DIPHENHYDRAMINE HCL 50 MG/ML IJ SOLN: 12.5000 mg | INTRAMUSCULAR | Status: DC | PRN

## 2017-07-03 MED ORDER — HYDRALAZINE HCL 20 MG/ML IJ SOLN
INTRAMUSCULAR | Status: AC
  Administered 2017-07-03: 10 mg via INTRAVENOUS
  Filled 2017-07-03: qty 1

## 2017-07-03 MED ORDER — MAGNESIUM SULFATE 40 G IN LACTATED RINGERS - SIMPLE
2.0000 g/h | INTRAVENOUS | Status: AC
  Administered 2017-07-04: 2 g/h via INTRAVENOUS
  Filled 2017-07-03: qty 500
  Filled 2017-07-03: qty 40

## 2017-07-03 MED ORDER — DIPHENHYDRAMINE HCL 25 MG PO CAPS
25.0000 mg | ORAL_CAPSULE | ORAL | Status: DC | PRN
  Filled 2017-07-03: qty 1

## 2017-07-03 MED ORDER — METHYLERGONOVINE MALEATE 0.2 MG/ML IJ SOLN
INTRAMUSCULAR | Status: AC
  Administered 2017-07-03: 0.2 mg
  Filled 2017-07-03: qty 1

## 2017-07-03 MED ORDER — MAGNESIUM SULFATE BOLUS VIA INFUSION
4.0000 g | INTRAVENOUS | Status: AC
  Administered 2017-07-03: 4 g via INTRAVENOUS
  Filled 2017-07-03: qty 500

## 2017-07-03 MED ORDER — MEPERIDINE HCL 25 MG/ML IJ SOLN: 6.2500 mg | INTRAMUSCULAR | Status: DC | PRN

## 2017-07-03 MED ORDER — IBUPROFEN 600 MG PO TABS: 600.0000 mg | ORAL_TABLET | Freq: Four times a day (QID) | ORAL | Status: DC

## 2017-07-03 MED ORDER — AZITHROMYCIN 500 MG IV SOLR
500.0000 mg | Freq: Once | INTRAVENOUS | Status: AC
  Administered 2017-07-03: 500 mg via INTRAVENOUS
  Filled 2017-07-03 (×2): qty 500

## 2017-07-03 MED ORDER — BUPIVACAINE IN DEXTROSE 0.75-8.25 % IT SOLN
INTRATHECAL | Status: AC
  Filled 2017-07-03: qty 2

## 2017-07-03 MED ORDER — NALBUPHINE HCL 10 MG/ML IJ SOLN: 5.0000 mg | Freq: Once | INTRAMUSCULAR | Status: DC | PRN

## 2017-07-03 MED ORDER — MORPHINE SULFATE (PF) 0.5 MG/ML IJ SOLN
INTRAMUSCULAR | Status: DC | PRN
  Administered 2017-07-03: .2 mg via INTRATHECAL

## 2017-07-03 MED ORDER — ACETAMINOPHEN 325 MG PO TABS
650.0000 mg | ORAL_TABLET | ORAL | Status: DC | PRN
  Administered 2017-07-04: 650 mg via ORAL
  Filled 2017-07-03: qty 2

## 2017-07-03 MED ORDER — NALOXONE HCL 2 MG/2ML IJ SOSY
1.0000 ug/kg/h | PREFILLED_SYRINGE | INTRAMUSCULAR | Status: DC | PRN
  Filled 2017-07-03: qty 2

## 2017-07-03 MED ORDER — CEFAZOLIN SODIUM-DEXTROSE 2-4 GM/100ML-% IV SOLN
INTRAVENOUS | Status: AC
  Filled 2017-07-03: qty 100

## 2017-07-03 MED ORDER — ONDANSETRON HCL 4 MG/2ML IJ SOLN: 4.0000 mg | Freq: Three times a day (TID) | INTRAMUSCULAR | Status: DC | PRN

## 2017-07-03 MED ORDER — MENTHOL 3 MG MT LOZG: 1.0000 | LOZENGE | OROMUCOSAL | Status: DC | PRN

## 2017-07-03 MED ORDER — DIPHENHYDRAMINE HCL 25 MG PO CAPS: 25.0000 mg | ORAL_CAPSULE | Freq: Four times a day (QID) | ORAL | Status: DC | PRN

## 2017-07-03 MED ORDER — PROMETHAZINE HCL 25 MG/ML IJ SOLN
INTRAMUSCULAR | Status: AC
  Administered 2017-07-03: 12.5 mg via INTRAVENOUS
  Filled 2017-07-03: qty 1

## 2017-07-03 MED ORDER — FENTANYL CITRATE (PF) 100 MCG/2ML IJ SOLN: 25.0000 ug | INTRAMUSCULAR | Status: DC | PRN

## 2017-07-03 MED ORDER — CEFAZOLIN SODIUM-DEXTROSE 2-4 GM/100ML-% IV SOLN
2.0000 g | INTRAVENOUS | Status: DC
  Filled 2017-07-03: qty 100

## 2017-07-03 MED ORDER — FAMOTIDINE IN NACL 20-0.9 MG/50ML-% IV SOLN
20.0000 mg | Freq: Once | INTRAVENOUS | Status: AC
  Administered 2017-07-03: 20 mg via INTRAVENOUS
  Filled 2017-07-03: qty 50

## 2017-07-03 MED ORDER — CEFAZOLIN SODIUM-DEXTROSE 2-3 GM-% IV SOLR
INTRAVENOUS | Status: DC | PRN
  Administered 2017-07-03: 2 g via INTRAVENOUS

## 2017-07-03 MED ORDER — LACTATED RINGERS IV BOLUS (SEPSIS)
1000.0000 mL | Freq: Once | INTRAVENOUS | Status: AC
  Administered 2017-07-03: 1000 mL via INTRAVENOUS

## 2017-07-03 MED ORDER — PHENYLEPHRINE 8 MG IN D5W 100 ML (0.08MG/ML) PREMIX OPTIME
INJECTION | INTRAVENOUS | Status: AC
  Filled 2017-07-03: qty 100

## 2017-07-03 MED ORDER — SODIUM CHLORIDE 0.9% FLUSH
INTRAVENOUS | Status: AC
  Filled 2017-07-03: qty 12

## 2017-07-03 MED ORDER — SODIUM CHLORIDE 0.9% FLUSH: 3.0000 mL | INTRAVENOUS | Status: DC | PRN

## 2017-07-03 MED ORDER — MORPHINE SULFATE (PF) 0.5 MG/ML IJ SOLN
INTRAMUSCULAR | Status: AC
  Filled 2017-07-03: qty 10

## 2017-07-03 MED ORDER — SCOPOLAMINE 1 MG/3DAYS TD PT72
MEDICATED_PATCH | TRANSDERMAL | Status: AC
  Filled 2017-07-03: qty 1

## 2017-07-03 MED ORDER — LACTATED RINGERS IV SOLN
INTRAVENOUS | Status: DC | PRN
  Administered 2017-07-03 (×3): via INTRAVENOUS

## 2017-07-03 MED ORDER — WITCH HAZEL-GLYCERIN EX PADS: 1.0000 "application " | MEDICATED_PAD | CUTANEOUS | Status: DC | PRN

## 2017-07-03 MED ORDER — DIBUCAINE 1 % RE OINT: 1.0000 "application " | TOPICAL_OINTMENT | RECTAL | Status: DC | PRN

## 2017-07-03 SURGICAL SUPPLY — 37 items
BENZOIN TINCTURE PRP APPL 2/3 (GAUZE/BANDAGES/DRESSINGS) ×3 IMPLANT
CANISTER SUCT 3000ML PPV (MISCELLANEOUS) ×3 IMPLANT
CHLORAPREP W/TINT 26ML (MISCELLANEOUS) ×3 IMPLANT
CLOSURE WOUND 1/2 X4 (GAUZE/BANDAGES/DRESSINGS) ×1
DRSG OPSITE POSTOP 4X10 (GAUZE/BANDAGES/DRESSINGS) ×3 IMPLANT
ELECT REM PT RETURN 9FT ADLT (ELECTROSURGICAL) ×3
ELECTRODE REM PT RTRN 9FT ADLT (ELECTROSURGICAL) ×1 IMPLANT
GLOVE INDICATOR 7.0 STRL GRN (GLOVE) ×12 IMPLANT
GLOVE INDICATOR 7.5 STRL GRN (GLOVE) ×3 IMPLANT
GLOVE SKINSENSE NS SZ7.0 (GLOVE) ×4
GLOVE SKINSENSE STRL SZ7.0 (GLOVE) ×2 IMPLANT
GOWN STRL REUS W/ TWL LRG LVL3 (GOWN DISPOSABLE) ×2 IMPLANT
GOWN STRL REUS W/ TWL XL LVL3 (GOWN DISPOSABLE) ×2 IMPLANT
GOWN STRL REUS W/TWL LRG LVL3 (GOWN DISPOSABLE) ×4
GOWN STRL REUS W/TWL XL LVL3 (GOWN DISPOSABLE) ×4
NS IRRIG 1000ML POUR BTL (IV SOLUTION) ×3 IMPLANT
PACK C SECTION WH (CUSTOM PROCEDURE TRAY) ×3 IMPLANT
PAD ABD 7.5X8 STRL (GAUZE/BANDAGES/DRESSINGS) ×3 IMPLANT
PAD OB MATERNITY 4.3X12.25 (PERSONAL CARE ITEMS) ×3 IMPLANT
PAD PREP 24X48 CUFFED NSTRL (MISCELLANEOUS) ×6 IMPLANT
PENCIL SMOKE EVAC W/HOLSTER (ELECTROSURGICAL) ×3 IMPLANT
RTRCTR C-SECT PINK 25CM LRG (MISCELLANEOUS) ×3 IMPLANT
SPONGE LAP 18X18 X RAY DECT (DISPOSABLE) ×3 IMPLANT
STRIP CLOSURE SKIN 1/2X4 (GAUZE/BANDAGES/DRESSINGS) ×2 IMPLANT
SUT MON AB 2-0 CT1 27 (SUTURE) ×3 IMPLANT
SUT MON AB 4-0 PS1 27 (SUTURE) ×3 IMPLANT
SUT MON AB-0 CT1 36 (SUTURE) ×6 IMPLANT
SUT PLAIN 0 NONE (SUTURE) ×3 IMPLANT
SUT PLAIN 2 0 (SUTURE) ×2
SUT PLAIN ABS 2-0 CT1 27XMFL (SUTURE) ×1 IMPLANT
SUT VIC AB 0 CT1 36 (SUTURE) ×6 IMPLANT
SUT VIC AB 2-0 CTX 36 (SUTURE) ×6 IMPLANT
SUT VIC AB 3-0 CT1 27 (SUTURE) ×2
SUT VIC AB 3-0 CT1 TAPERPNT 27 (SUTURE) ×1 IMPLANT
SUT VICRYL 2 0 18  TIES (SUTURE) ×2
SUT VICRYL 2 0 18 TIES (SUTURE) ×1 IMPLANT
TOWEL OR 17X24 6PK STRL BLUE (TOWEL DISPOSABLE) ×3 IMPLANT

## 2017-07-03 NOTE — H&P (Addendum)
Obstetrics Admission History & Physical  07/03/2017 - 10:31 AM Primary OBGYN: [facility]  Chief Complaint: SROM at 0730 today  History of Present Illness  41 y.o. [id number] @ 154w1d, with the above CC. Pregnancy complicated by: AMA, di-di twins.  [name] states that she had SROM clear at 0730. No UCs, VB. NPO since about 0100 today. No s/s of pre-x  Review of Systems: as noted in the History of Present Illness.   PMHx:  Past Medical History:  Diagnosis Date  . Medical history non-contributory    PSHx:  Past Surgical History:  Procedure Laterality Date  . NO PAST SURGERIES     Medications:  Prescriptions Prior to Admission  Medication Sig Dispense Refill Last Dose  . aspirin EC 81 MG tablet Take 1 tablet (81 mg total) by mouth daily. 60 tablet 3 Past Week at Unknown time  . Prenatal Vit-Fe Fumarate-FA (MULTIVITAMIN-PRENATAL) 27-0.8 MG TABS tablet Take 1 tablet by mouth daily at 12 noon.   Past Week at Unknown time     Allergies: has No Known Allergies. OBHx:  OB History  Gravida Para Term Preterm AB Living  4 3 3     3   SAB TAB Ectopic Multiple Live Births        0 3    # Outcome Date GA Lbr Len/2nd Weight Sex Delivery Anes PTL Lv  4 Current           3 Term 06/17/15 6446w4d 16:00 / 00:16 3.595 kg (7 lb 14.8 oz) M Vag-Spont EPI  LIV     Birth Comments: WNL  2 Term 05/18/99 4425w0d  3.629 kg (8 lb) F Vag-Spont None N LIV  1 Term 07/22/98 4463w0d   M Vag-Spont None N LIV                FHx: History reviewed. No pertinent family history. Soc Hx:  Social History   Social History  . Marital status: Married    Spouse name: N/A  . Number of children: N/A  . Years of education: N/A   Occupational History  . Not on file.   Social History Main Topics  . Smoking status: Never Smoker  . Smokeless tobacco: Never Used  . Alcohol use No  . Drug use: No  . Sexual activity: Yes   Other Topics Concern  . Not on file   Social History Narrative  .  No narrative on file    Objective    Current Vital Signs 24h Vital Sign Ranges  T 97.8 F (36.6 C) Temp  Avg: 97.8 F (36.6 C)  Min: 97.8 F (36.6 C)  Max: 97.8 F (36.6 C)  BP (!) 146/82 BP  Min: 146/82  Max: 149/82  HR 74 Pulse  Avg: 77.3  Min: 74  Max: 79  RR 18 Resp  Avg: 18  Min: 18  Max: 18  SaO2     No Data Recorded       24 Hour I/O Current Shift I/O  Time Ins Outs No intake/output data recorded. No intake/output data recorded.   EFM: category I x 2  Toco: +irritability  General: Well nourished, well developed female in no acute distress.  Skin:  Warm and dry.  Cardiovascular: S1, S2 normal, no murmur, rub or gallop, regular rate and rhythm Respiratory:  Clear to auscultation bilateral. Normal respiratory effort Abdomen: gravid, nttp Neuro/Psych:  Normal mood and affect.    Labs  O POS  Recent  Labs Lab 07/03/17 0942  WBC 7.5  HGB 13.6  HCT 38.3  PLT 240     Recent Labs Lab 07/03/17 0942  NA 134*  K 4.5  CL 107  CO2 20*  BUN 16  CREATININE 0.69  CALCIUM 8.4*  PROT 5.3*  BILITOT 0.4  ALKPHOS 195*  ALT 15  AST 26  GLUCOSE 79    Radiology Breech/breech on 9/5 and discordance <20% on 8/29  Assessment & Plan   41 y.o. [id number] @ [redacted]w[redacted]d with PROM. Pt doing well *Pregnancy: fetal status reassuring *PROM: d/w pt that if fetus A was ceph would she want to try for VD and she says no. Risks of c-section d/w her and with BTL and she desires primary c-section and BTL. azithro in addition to ancef ordered given PROM *GBS: unknown, no RFs. Had appt tomorrow. gbs done now *gHTN: follow up pre-x labs *Analgesia: no needs currently  Interpreter used  Can proceed when OR is ready.   Cornelia Copa MD Attending Center for [facility])

## 2017-07-03 NOTE — Anesthesia Procedure Notes (Addendum)
Spinal  Patient location during procedure: OR Start time: 07/03/2017 11:20 AM Staffing Anesthesiologist: Mal AmabileFOSTER, [name] Performed: anesthesiologist  Preanesthetic Checklist Completed: patient identified, site marked, surgical consent, pre-op evaluation, timeout performed, IV checked, risks and benefits discussed and monitors and equipment checked Spinal Block Patient position: sitting Prep: DuraPrep Patient monitoring: heart rate, cardiac monitor, continuous pulse ox and blood pressure Approach: midline Location: L4-5 Injection technique: single-shot Needle Needle type: Pencan  Needle gauge: 24 G Needle length: 9 cm Needle insertion depth: 8 cm Assessment Sensory level: T4 Additional Notes Patient tolerated procedure well. Adequate sensory level.

## 2017-07-03 NOTE — MAU Note (Signed)
Notified Dr. Vergie LivingPickens patient G4P3 177w1d Roberta Marshall, SROM 0700 today clear fluid, fhrs reactive, UI, received orders to prep for C/S and obtain PIH labs.

## 2017-07-03 NOTE — Transfer of Care (Signed)
Immediate Anesthesia Transfer of Care Note  Patient: Roberta Marshall  Procedure(s) Performed: Procedure(s): CESAREAN SECTION MULTI-GESTATIONAL (N/A)  Patient Location: PACU  Anesthesia Type:Regional and Spinal  Level of Consciousness: awake, alert  and oriented  Airway & Oxygen Therapy: Patient Spontanous Breathing  Post-op Assessment: Report given to RN and Post -op Vital signs reviewed and stable  Post vital signs: Reviewed and stable  Last Vitals:  Vitals:   07/03/17 1042 07/03/17 1255  BP: (!) 155/82 114/60  Pulse: 71 70  Resp:  13  Temp:  36.4 C  SpO2:  94%    Last Pain:  Vitals:   07/03/17 1255  TempSrc: Oral         Complications: No apparent anesthesia complications. EBL 1500. Dr. Vergie LivingPickens aware

## 2017-07-03 NOTE — Op Note (Signed)
Operative Note   SURGERY DATE: 07/03/2017  PRE-OP DIAGNOSIS:  *Pregnancy at 37/1 weeks *Premature rupture of membranes *Dichorionic-Diamniotic twins *Desire for cesarean section *Desire for permanent sterilization *Gestational hypertension diagnosed in OB triage  POST-OP DIAGNOSIS: Same. Breech/BreechDelivered. Postpartum hemorrhage   PROCEDURE: primary low transverse cesarean section via pfannenstiel skin incision with double layer uterine closure with bilateral tubal ligation via Modified  Parkland method  SURGEON: Surgeon(s) and Role:    * [place] BingPickens, Eljay Lave, MD - Primary  ASSISTANT: None  ANESTHESIA: spinal  ESTIMATED BLOOD LOSS: 1200mL  DRAINS: 300mL UOP via indwelling foley  TOTAL IV FLUIDS: 2500mL crystalloid  VTE PROPHYLAXIS: SCDs to bilateral lower extremities  ANTIBIOTICS: Two grams of Cefazolin were given within 1 hour of skin incision and azithromycin 500mg  IV x given during the case  SPECIMENS: placenta and portions of bilateral tubes to pathology  COMPLICATIONS: PPH likey due to delayed cord clamping x 2. No atony during case.  FINDINGS: No intra-abdominal adhesions were noted. Grossly normal uterus, tubes and ovaries.  Twin A: No amniotic fluid, complete breech female infant, weight 2340gm, APGARs 9/9, intact placenta. Twin B: clear amniotic fluid (subjectively normal amount), complete breech, weight 3025gm, APGARs 9/9, intact placenta Complete cross section of bilateral fallopian tubes.   PROCEDURE IN DETAIL: The patient was taken to the operating room where anesthesia was administered and normal fetal heart tones were confirmed. She was then prepped and draped in the normal fashion in the dorsal supine position with a leftward tilt.  After a time out was performed, a pfannensteil  skin incision was made with the scalpel and carried through to the underlying layer of fascia. The fascia was then incised at the midline and this incision was extended laterally with  the mayo scissors. Attention was turned to the superior aspect of the fascial incision which was grasped with the kocher clamps x 2, tented up and the rectus muscles were dissected off with the bovie. In a similar fashion the inferior aspect of the fascial incision was grasped with the kocher clamps, tented up and the rectus muscles dissected off with the mayo scissors. The rectus muscles were then separated in the midline and the peritoneum was entered bluntly. The Alexis wound retractor was inserted and the vesicouterine peritoneum was identified, tented up and entered with the metzenbaum scissors. This incision was extended laterally and the bladder flap was created digitally.   A low transverse hysterotomy was made with the scalpel until the endometrial cavity was breached and the amniotic sac for twin A entered This incision was extended bluntly and twin A's leg came through the incision and the rest of the body and head were easily delivered using standard breech maneuvers.The cord was clamped x 2 and cut, and the infant was handed to the awaiting pediatricians, after delayed cord clamping was done for one minute.  The sac for twin B was then ruptured with an Allis clamp and the leg came out and twin B was easily delivered, as with twin A; delayed cord clamping x 1 minute was also done.  The placenta was then gradually expressed from the uterus and then the uterus was exteriorized and cleared of all clots and debris. The hysterotomy was repaired with a running suture of 1-0 monocryl. A second imbricating layer of 1-0 monocryl suture was then placed. One figure-of-eight suture of 1-0 monocryl was added to achieve excellent hemostasis.   The left Fallopian tube was identified by tracing out to the fimbraie, grasped with the [employer]  clamps. An avascular midsection of the tube approximately 3-4cm from the cornua was grasped with the babcock clamps and the distal and proximal aspects were ligated with a suture  of 1-0 vicryl, with the intervening portion of tube was transected and removed, via the Metzenbaum scissors; another suture tie was placed below both stumps for prophylaxis for hemostasis.  Attention was then turned to the right fallopian tube after confirmation by tracing the tube out to the fimbriae. The same procedure was then performed on the right Fallopian tube, with excellent hemostasis was noted from both BTL sites.   The uterus and adnexa were then returned to the abdomen, and the hysterotomy and all operative sites were reinspected and excellent hemostasis was noted after irrigation and suction of the abdomen with warm saline.  The peritoneum was closed with a running stitch of 3-0 Vicryl. The fascia was reapproximated with 0 Vicryl in a simple running fashion bilaterally. The subcutaneous layer was then reapproximated with interrupted sutures of 2-0 plain gut, and the skin was then closed with 4-0 monocryl, in a subcuticular fashion.  The patient  tolerated the procedure well. Sponge, lap, needle, and instrument counts were correct x 2. The patient was transferred to the recovery room awake, alert and breathing independently in stable condition.  Cornelia Copa MD Attending Center for [facility])

## 2017-07-03 NOTE — Anesthesia Postprocedure Evaluation (Signed)
Anesthesia Post Note  Patient: Roberta Marshall  Procedure(s) Performed: Procedure(s) (LRB): CESAREAN SECTION MULTI-GESTATIONAL (N/A)     Patient location during evaluation: PACU Anesthesia Type: Spinal Level of consciousness: oriented and awake and alert Pain management: pain level controlled Vital Signs Assessment: post-procedure vital signs reviewed and stable Respiratory status: spontaneous breathing, respiratory function stable and nonlabored ventilation Cardiovascular status: blood pressure returned to baseline and stable Postop Assessment: no headache, no backache and spinal receding Anesthetic complications: no    Last Vitals:  Vitals:   07/03/17 1315 07/03/17 1330  BP: 122/79 (!) 143/98  Pulse: 60 64  Resp: 12 17  Temp:    SpO2: 99% 100%    Last Pain:  Vitals:   07/03/17 1255  TempSrc: Oral   Pain Goal:                 [name] A.

## 2017-07-03 NOTE — MAU Note (Signed)
Dr. Malen GauzeFoster with anesthesia at bedside.

## 2017-07-03 NOTE — MAU Note (Signed)
Pt states she woke up with her underwear wet around 0700, got up & was leaking fluid, clear fluid.  Continues to feel wet now.  Denies contractions or bleeding.

## 2017-07-03 NOTE — Anesthesia Preprocedure Evaluation (Signed)
Anesthesia Evaluation  Patient identified by MRN, date of birth, ID band Patient awake    Reviewed: Allergy & Precautions, NPO status , Patient's Chart, lab work & pertinent test results  History of Anesthesia Complications Negative for: history of anesthetic complications  Airway Mallampati: II  TM Distance: >3 FB Neck ROM: Full    Dental no notable dental hx. (+) Dental Advisory Given   Pulmonary neg pulmonary ROS,    Pulmonary exam normal breath sounds clear to auscultation       Cardiovascular + Peripheral Vascular Disease  negative cardio ROS Normal cardiovascular exam Rhythm:Regular Rate:Normal     Neuro/Psych negative neurological ROS  negative psych ROS   GI/Hepatic negative GI ROS, Neg liver ROS,   Endo/Other  negative endocrine ROSObesity  Renal/GU negative Renal ROS  negative genitourinary   Musculoskeletal negative musculoskeletal ROS (+)   Abdominal (+) + obese,   Peds negative pediatric ROS (+)  Hematology negative hematology ROS (+) anemia ,   Anesthesia Other Findings   Reproductive/Obstetrics (+) Pregnancy Twin gestation 37 weeks SROM Breech/Breech                             Anesthesia Physical  Anesthesia Plan  ASA: II  Anesthesia Plan: Epidural   Post-op Pain Management:    Induction:   PONV Risk Score and Plan:   Airway Management Planned: Natural Airway  Additional Equipment:   Intra-op Plan:   Post-operative Plan: Extubation in OR  Informed Consent: I have reviewed the patients History and Physical, chart, labs and discussed the procedure including the risks, benefits and alternatives for the proposed anesthesia with the patient or authorized representative who has indicated his/her understanding and acceptance.   Dental advisory given  Plan Discussed with: CRNA, Anesthesiologist and Surgeon  Anesthesia Plan Comments:          Anesthesia Quick Evaluation

## 2017-07-04 ENCOUNTER — Encounter: Payer: Self-pay | Admitting: Obstetrics & Gynecology

## 2017-07-04 LAB — COMPREHENSIVE METABOLIC PANEL
ALT: 15 U/L (ref 14–54)
AST: 29 U/L (ref 15–41)
Albumin: 1.7 g/dL — ABNORMAL LOW (ref 3.5–5.0)
Alkaline Phosphatase: 138 U/L — ABNORMAL HIGH (ref 38–126)
Anion gap: 8 (ref 5–15)
BUN: 10 mg/dL (ref 6–20)
CHLORIDE: 104 mmol/L (ref 101–111)
CO2: 22 mmol/L (ref 22–32)
CREATININE: 0.59 mg/dL (ref 0.44–1.00)
Calcium: 7.1 mg/dL — ABNORMAL LOW (ref 8.9–10.3)
GFR calc Af Amer: >60 mL/min (ref >60)
GFR calc non Af Amer: >60 mL/min (ref >60)
Glucose, Bld: 93 mg/dL (ref 65–99)
Potassium: 4.2 mmol/L (ref 3.5–5.1)
SODIUM: 134 mmol/L — AB (ref 135–145)
Total Bilirubin: 0.4 mg/dL (ref 0.3–1.2)
Total Protein: 5 g/dL — ABNORMAL LOW (ref 6.5–8.1)

## 2017-07-04 LAB — CBC
HCT: 34 % — ABNORMAL LOW (ref 36.0–46.0)
Hemoglobin: 12 g/dL (ref 12.0–15.0)
MCH: 30.2 pg (ref 26.0–34.0)
MCHC: 35.3 g/dL (ref 30.0–36.0)
MCV: 85.4 fL (ref 78.0–100.0)
PLATELETS: 241 10*3/uL (ref 150–400)
RBC: 3.98 MIL/uL (ref 3.87–5.11)
RDW: 15.4 % (ref 11.5–15.5)
WBC: 14.6 10*3/uL — AB (ref 4.0–10.5)

## 2017-07-04 LAB — RPR: RPR Ser Ql: NONREACTIVE

## 2017-07-04 NOTE — Lactation Note (Signed)
This note was copied from a baby's chart. Lactation Consultation Note  Patient Name: Roberta Marshall ZOXWR'UToday's Date: 07/04/2017 Reason for consult: Initial assessment;Multiple gestation;Infant < 6lbs   Baby boy A weight 4 lbs 15.5 oz, and baby Boy B 6 lbs 6 oz. They are now 7222 hours old. Mom has put the babies to breast once so far, and is bottle/formula feeding. Mom does want to provide EBM, so I set up a DEP, and advised her to pump every 3 hours, in initiation setting, followed by hand expression. Mom knows to use EBM prior to fonts to follow. Eda, Spanish interpreter, was present for part of the consult. Mom knows to have her nurse call for lactation as needed.    Maternal Data Formula Feeding for Exclusion: Yes Reason for exclusion: Mother's choice to formula and breast feed on admission Does the patient have breastfeeding experience prior to this delivery?: Yes  Feeding Feeding Type: Bottle Fed - Formula  LATCH Score                   Interventions    Lactation Tools Discussed/Used WIC Program: No (fax to be sent [facility] and rec[facility]) Pump Review: Setup, frequency, and cleaning;Milk Storage;Other (comment) (hand expression, pump use and setting) Initiated by:: Danton Claphristine Cortlin Marano, RN IBCLC Date initiated:: 07/04/17   Consult Status Consult Status: Follow-up Date: 07/05/17 Follow-up type: In-patient    Roberta Marshall, Roberta Marshall 07/04/2017, 10:48 AM

## 2017-07-04 NOTE — Progress Notes (Signed)
MOB provided with 4 bottles of formula & nipples for twins in room. Sheryn BisonGordon, Kerman Pfost Warner

## 2017-07-04 NOTE — Plan of Care (Signed)
Problem: Education: Goal: Knowledge of condition will improve Outcome: Completed/Met Date Met: 07/03/17 With assist from [place], [place] interpreter.

## 2017-07-04 NOTE — Progress Notes (Signed)
Subjective: Postpartum Day 1: Cesarean Delivery  Pt feels sleepily with magnesium. No HA or blurry vision. Tolerating diet. Pain controlled. Bottle feeding. Bleeding min.   Objective: Vital signs in last 24 hours: Temp:  [97.6 F (36.4 C)-98.4 F (36.9 C)] 97.8 F (36.6 C) (09/10 0738) Pulse Rate:  [55-74] 70 (09/10 0738) Resp:  [9-20] 16 (09/10 0738) BP: (114-163)/(60-98) 133/69 (09/10 0738) SpO2:  [94 %-100 %] 98 % (09/10 0738)  Physical Exam:  General: alert Lochia: appropriate Uterine Fundus: firm Incision: healing well DVT Evaluation: No evidence of DVT seen on physical exam.   Recent Labs  07/03/17 1650 07/04/17 0637  HGB 14.5 12.0  HCT 41.5 34.0*    Assessment/Plan: Status post Cesarean section. Doing well postoperatively. BP stable on magnesium. Will d/c later today and observe BP.  Continue current care.  Roberta StaggersMichael L Escher Harr 07/04/2017, 10:10 AM

## 2017-07-05 ENCOUNTER — Encounter (HOSPITAL_COMMUNITY): Payer: Self-pay | Admitting: *Deleted

## 2017-07-05 LAB — CULTURE, BETA STREP (GROUP B ONLY)

## 2017-07-05 MED ORDER — AMLODIPINE BESYLATE 10 MG PO TABS
10.0000 mg | ORAL_TABLET | Freq: Every day | ORAL | Status: DC
  Administered 2017-07-05 – 2017-07-06 (×2): 10 mg via ORAL
  Filled 2017-07-05 (×3): qty 1

## 2017-07-05 NOTE — Progress Notes (Signed)
Subjective: Postpartum Day 2: Cesarean Delivery and SPEC Pt without complaints except for some gas pain. Tolerating diet. Voiding without problems. Pain controlled. Breast feeding No HA or blurry vision   Objective: Vital signs in last 24 hours: Temp:  [97.7 F (36.5 C)-98.5 F (36.9 C)] 98.5 F (36.9 C) (09/11 0800) Pulse Rate:  [70-82] 76 (09/11 0800) Resp:  [16-18] 18 (09/11 0800) BP: (121-149)/(68-80) 149/75 (09/11 0800) SpO2:  [95 %-99 %] 99 % (09/11 0800) Weight:  [105.2 kg (232 lb)] 105.2 kg (232 lb) (09/10 1119)  Physical Exam:  General: alert Lochia: appropriate Uterine Fundus: firm Incision: healing well DVT Evaluation: No evidence of DVT seen on physical exam.   Recent Labs  07/03/17 1650 07/04/17 0637  HGB 14.5 12.0  HCT 41.5 34.0*    Assessment/Plan: POD # 2 LTCS SPEC  Off magnesium. BP slightly increased will start Norvasc. Encourage ambulation. Continue with progressive care    Hermina StaggersMichael L Dennisse Swader 07/05/2017, 9:03 AM

## 2017-07-06 ENCOUNTER — Other Ambulatory Visit (HOSPITAL_COMMUNITY): Payer: Self-pay

## 2017-07-06 LAB — COMPREHENSIVE METABOLIC PANEL
ALBUMIN: 1.8 g/dL — AB (ref 3.5–5.0)
ALT: 29 U/L (ref 14–54)
ANION GAP: 8 (ref 5–15)
AST: 41 U/L (ref 15–41)
Alkaline Phosphatase: 111 U/L (ref 38–126)
BILIRUBIN TOTAL: 0.5 mg/dL (ref 0.3–1.2)
BUN: 10 mg/dL (ref 6–20)
CO2: 25 mmol/L (ref 22–32)
Calcium: 8.3 mg/dL — ABNORMAL LOW (ref 8.9–10.3)
Chloride: 105 mmol/L (ref 101–111)
Creatinine, Ser: 0.54 mg/dL (ref 0.44–1.00)
GFR calc non Af Amer: >60 mL/min (ref >60)
Glucose, Bld: 77 mg/dL (ref 65–99)
POTASSIUM: 4.4 mmol/L (ref 3.5–5.1)
Sodium: 138 mmol/L (ref 135–145)
TOTAL PROTEIN: 5.1 g/dL — AB (ref 6.5–8.1)

## 2017-07-06 LAB — CBC
HCT: 33.3 % — ABNORMAL LOW (ref 36.0–46.0)
Hemoglobin: 11.2 g/dL — ABNORMAL LOW (ref 12.0–15.0)
MCH: 30.1 pg (ref 26.0–34.0)
MCHC: 33.6 g/dL (ref 30.0–36.0)
MCV: 89.5 fL (ref 78.0–100.0)
PLATELETS: 290 10*3/uL (ref 150–400)
RBC: 3.72 MIL/uL — ABNORMAL LOW (ref 3.87–5.11)
RDW: 16.1 % — AB (ref 11.5–15.5)
WBC: 10.1 10*3/uL (ref 4.0–10.5)

## 2017-07-06 LAB — PROTEIN / CREATININE RATIO, URINE
CREATININE, URINE: 17 mg/dL
PROTEIN CREATININE RATIO: 2.24 mg/mg{creat} — AB (ref 0.00–0.15)
TOTAL PROTEIN, URINE: 38 mg/dL

## 2017-07-06 MED ORDER — IBUPROFEN 600 MG PO TABS: 600.0000 mg | ORAL_TABLET | Freq: Four times a day (QID) | ORAL | 0 refills | Status: AC | PRN

## 2017-07-06 MED ORDER — AMLODIPINE BESYLATE 10 MG PO TABS: 10.0000 mg | ORAL_TABLET | Freq: Every day | ORAL | 1 refills | Status: AC

## 2017-07-06 NOTE — Progress Notes (Signed)
Post Partum Day 2  Subjective: G4P4005 POD#2 from pLTCS/BTL for di-di twins, breech.  She is doing well.  She does report headache that began overnight and unrelieved by ibuprofen.  She has history of migraines and reports it is similar to her migraines that are usually resolved with tylenol.  She denies visual changes, chest pain, shortness of breath, RUQ/epigastric pain or worsening edema.  She is ambulating well.  She has passed flatus; no BM yet.  She is tolerating regular diet.  Bottle feeding currently but wants to begin breast feeding.  Lochia is minimal.   Objective: Blood pressure 129/76, pulse 74, temperature (!) 97.5 F (36.4 C), resp. rate 18, weight 105.2 kg (232 lb), last menstrual period 10/16/2016, SpO2 100 %, unknown if currently breastfeeding.  Physical Exam:  General: alert and cooperative Lochia: appropriate Uterine Fundus: firm Incision: no significant drainage, no dehiscence, no significant erythema DVT Evaluation: No evidence of DVT seen on physical exam.   Recent Labs  07/03/17 1650 07/04/17 0637  HGB 14.5 12.0  HCT 41.5 34.0*    Assessment/Plan:  G4P4005 POD#2 from pLTCS/BTL for di-di twins, breech.  She developed severe pre-eclampsia and is status post Magnesium.   Continue routine postpartum care Headache -- will obtain stat CMP and repeat urine protein:creatinine ratio, BP has been normal range on Norvasc, monitor closely; likely a migraine exacerbated by lack of sleep Pre-eclampsia -- s/p Magnesium, started on Norvasc with BP controlled, repeat labs as above given headache Bottle feeding S/p BTL  Monitor today; pending headache, lab results and infant clinical progress, could be discharged later this evening.  LOS: 3 days   Larene BeachMary K Key, DO PGY-2 07/06/2017, 8:51 AM   OB FELLOW POSTPARTUM PROGRESS NOTE ATTESTATION  I have seen and examined this patient and agree with above documentation in the resident's note.  --Labs reviewed.  --BP  well-controlled on Norvasc. --D/c home with precautions and BP check office visit.   Frederik PearJulie P Degele, MD OB Fellow 1:11 PM

## 2017-07-06 NOTE — Discharge Summary (Signed)
OB Discharge Summary     Patient Name: Roberta Marshall DOB: 01-Mar-1976 MRN: [id number]  Date of admission: 07/03/2017 Delivering MD:    Dellis Marshall, [place] [[id number]]  Roberta, [place] [[id number]]  Roberta Marshall   Date of discharge: 07/06/2017  Admitting diagnosis: 39WKS WATER BROKE TWINS Twins Breech Undesired Fertility Intrauterine pregnancy: [redacted]w[redacted]d     Secondary diagnosis:  Principal Problem:   Status post cesarean delivery Active Problems:   Maternal age 41, multigravida, antepartum   Dichorionic diamniotic twin pregnancy, antepartum   Rupture of membranes with clear amniotic fluid   Severe preeclampsia   Status post tubal ligation at time of delivery, current hosp  Additional problems: rubella non-immune     Discharge diagnosis: Term Pregnancy Delivered and s/p LTCS, s/p BTL                                                                                                Post partum procedures:none  Augmentation: none  Complications: None  Hospital course:  ROM with C/S -- 41 y.o. yo now [id number] who was admitted at [redacted]w[redacted]d on 07/03/2017 for PROM of twin A. She was offered vaginal delivery but opted for C/S. She also desired BTL for permanent contraception. She had primary LTCS and BTL. Delivery information:   Roberta, Marshall [[id number]]  7:00 AM   Roberta Marshall, [name] [[id number]]  7:00 AM   Details of operation can be found in separate operative note. Postpartum course was complicated by severe pre-eclampsia, and she received IV magnesium for 24 hours. She was started on po amlodipine for BP control, and was discharged on.  She is ambulating,tolerating a regular diet, passing flatus, and urinating well.  Patient is discharged home in stable condition 07/07/17.  Physical exam  Vitals:   07/05/17 1420 07/05/17 1530 07/05/17 1828 07/06/17 0552  BP:  117/66 140/71 129/76  Pulse:  79 88 74  Resp: Temp:  97.8 F (36.6 C) 98.1 F (36.7 C) (!) 97.5 F (36.4 C)  TempSrc:  Oral Oral   SpO2:      Weight:       General: alert, cooperative and no distress Lochia: appropriate Uterine Fundus: firm Incision: Healing well with no significant drainage, honeycomb dressing in place DVT Evaluation: No evidence of DVT seen on physical exam. No significant calf/ankle edema. Labs: Lab Results  Component Value Date   WBC 10.1 07/06/2017   HGB 11.2 (L) 07/06/2017   HCT 33.3 (L) 07/06/2017   MCV 89.5 07/06/2017   PLT 290 07/06/2017   CMP Latest Ref Rng & Units 07/06/2017  Glucose 65 - 99 mg/dL 77  BUN 6 - 20 mg/dL 10  Creatinine 3.47 - 4.25 mg/dL 9.56  Sodium 387 - 564 mmol/L 138  Potassium 3.5 - 5.1 mmol/L 4.4  Chloride 101 - 111 mmol/L 105  CO2 22 - 32 mmol/L 25  Calcium 8.9 - 10.3 mg/dL 8.3(L)  Total Protein 6.5 - 8.1 g/dL 5.1(L)  Total Bilirubin 0.3 - 1.2 mg/dL 0.5  Alkaline Phos 38 -  126 U/L 111  AST 15 - 41 U/L 41  ALT 14 - 54 U/L 29    Discharge instruction: per After Visit Summary and "Baby and Me Booklet".  After visit meds:  Allergies as of 07/06/2017   No Known Allergies     Medication List    STOP taking these medications   aspirin EC 81 MG tablet     TAKE these medications   amLODipine 10 MG tablet Commonly known as:  NORVASC Take 1 tablet (10 mg total) by mouth daily.   ibuprofen 600 MG tablet Commonly known as:  ADVIL,MOTRIN Take 1 tablet (600 mg total) by mouth every 6 (six) hours as needed for headache, mild pain or cramping.   multivitamin-prenatal 27-0.8 MG Tabs tablet Take 1 tablet by mouth daily at 12 noon.            Discharge Care Instructions        Start     Ordered   07/07/17 0000  amLODipine (NORVASC) 10 MG tablet  Daily     07/06/17 1313   07/06/17 0000  ibuprofen (ADVIL,MOTRIN) 600 MG tablet  Every 6 hours PRN     07/06/17 1313   07/06/17 0000  Call MD for:  temperature >100.4     07/06/17 1313   07/06/17 0000  Call MD  for:  persistant nausea and vomiting     07/06/17 1313   07/06/17 0000  Call MD for:  severe uncontrolled pain     07/06/17 1313   07/06/17 0000  Call MD for:  difficulty breathing, headache or visual disturbances     07/06/17 1313   07/06/17 0000  Call MD for:  persistant dizziness or light-headedness     07/06/17 1313   07/06/17 0000  Sexual acrtivity    Comments:  No sexual activity or anything in the vagina for 6 weeks   07/06/17 1313   07/06/17 0000  Diet - low sodium heart healthy     07/06/17 1313      Diet: routine diet  Activity: Advance as tolerated. Pelvic rest for 6 weeks.   Outpatient follow up: 1 week for BP check  Postpartum contraception: Tubal Ligation  Newborn Data:   Roberta Marshall, BoyA Roberta Marshall [[id number]][[id number]]  Live born female  Birth Weight: 5 lb 2.5 oz (2340 g) APGAR: 9, [address]9   Roberta Marshall, BoyB Roberta Marshall [[id number]][[id number]]  Live born female  Birth Weight: 6 lb 10.7 oz (3025 g) APGAR: 9, 9  Baby Feeding: Bottle and Breast Disposition:home with mother   07/06/2017 Roberta PearJulie P Brunella Wileman, MD

## 2017-07-06 NOTE — Lactation Note (Signed)
This note was copied from a baby's chart. Lactation Consultation Note  Patient Name: Roberta Marshall ZOXWR'UToday's Date: 07/06/2017 Reason for consult: Follow-up assessment;Multiple gestation;Infant < 6lbs;Early term 37-38.6wks   Follow up with mom of 8969 hour old twins. Spoke with mom with assistance of [employer] Interpreter Katherine [phone]. [facility](Hospital Interpreter not available at this time). Mom reports she is just formula feeding because she has barely any milk. Discussed supply and demand and importance of frequent pumpings/hand expressing to stimulate milk production.   Twin A- 4 lb 15.2 oz with 4% weight loss since birth. Infant with 8 formula feeds via bottle of 5-30 cc, 3 voids and 3 stools in the last 24 hours.   Twin B 6 lb 5.1 oz with 5% weight loss since birth. Infant with 9 formula feeds of 15-30 cc via bottle , 5 voids and 3 stools in last 24 hours.   Mom is meeting with [employer] this morning to obtain a DEBP. Mom reports she has a single electric pump, recommended she use the hospital grade DEBP. Dr. Weston BrassEttafagh in to talk with parents.        Maternal Data Formula Feeding for Exclusion: Yes Has patient been taught Hand Expression?: Yes  Feeding Feeding Type: Bottle Fed - Formula  LATCH Score                   Interventions Interventions: DEBP  Lactation Tools Discussed/Used Pump Review: Setup, frequency, and cleaning;Milk Storage Initiated by:: Reviewed and encouraged   Consult Status Consult Status: Follow-up Date: 07/07/17 Follow-up type: In-patient    Roberta Marshall 07/06/2017, 9:10 AM

## 2017-07-06 NOTE — Discharge Instructions (Signed)
Parto por cesárea, cuidados posteriores °(Cesarean Delivery, Care After) °Siga estas instrucciones durante las próximas semanas. Estas indicaciones le proporcionan información acerca de cómo deberá cuidarse después del procedimiento. El médico también podrá darle instrucciones más específicas. El tratamiento ha sido planificado según las prácticas médicas actuales, pero en algunos casos pueden ocurrir problemas. Comuníquese con el médico si tiene algún problema o dudas después del procedimiento. °QUÉ ESPERAR DESPUÉS DEL PROCEDIMIENTO °Después del procedimiento, es común tener los siguientes síntomas: °· Una pequeña cantidad de sangre o un líquido transparente que sale de la incisión. °· Tiene enrojecimiento, hinchazón o dolor en la zona de la incisión. °· Dolores y molestias abdominales. °· Hemorragia vaginal (loquios). °· Calambres pélvicos. °· Fatiga. °INSTRUCCIONES PARA EL CUIDADO EN EL HOGAR °Cuidado de la incisión °· Siga las indicaciones del médico acerca del cuidado de la incisión. Haga lo siguiente: °? Lávese las manos con agua y jabón antes de cambiar las vendas (vendaje). Use desinfectante para manos si no dispone de agua y jabón. °? Cambie el vendaje como se lo haya indicado el médico. °? No retire los puntos (suturas), las grapas cutáneas, el pegamento para la piel o las tiras adhesivas. Es posible que estos deban quedar puestos en la piel durante 2 semanas o más tiempo. Si los bordes de las tiras adhesivas empiezan a despegarse y enroscarse, puede recortar los que están sueltos. No retire las tiras adhesivas por completo a menos que el médico se lo indique. °· Controle todos los días la zona de la incisión para detectar signos de infección. Esté atenta a los siguientes signos: °? Aumento del enrojecimiento, la hinchazón o el dolor. °? Más líquido o sangre. °? Calor. °? Pus o mal olor. °· Cuando tosa o estornude, abrace una almohada. Esto ayuda con el dolor y disminuye la posibilidad de que su incisión  se abra (dehiscencia). Haga esto hasta que cicatrice completamente. °Medicamentos °· Tome los medicamentos de venta libre y los recetados solamente como se lo haya indicado el médico. °· Si le recetaron un antibiótico, tómelo como se lo haya indicado el médico. No interrumpa la administración del antibiótico hasta que no lo hay terminado. °Conducir °· No conduzca ni opere maquinaria pesada mientras toma analgésicos recetados. °· No conduzca durante 24 horas si le administraron un sedante. °Estilo de vida °· No beba alcohol. Esto es de suma importancia si está amamantando o toma analgésicos. °· No consuma productos que contengan tabaco, incluidos cigarrillos, tabaco de mascar o cigarrillos electrónicos. Si necesita ayuda para dejar de fumar, consulte al médico. El tabaco puede retrasar la cicatrización. °Comida y bebida °· Beba al menos 8 vasos de 8 onzas (240 cc) de agua todos los días a menos que el médico le indique lo contrario. Si amamanta, quizá deba beber aún más cantidad de agua. °· Ingiera alimentos ricos en fibras todos los días. Estos alimentos pueden ayudarla a prevenir o aliviar el estreñimiento. Los alimentos ricos en fibras incluyen, entre otros: °? Panes y cereales integrales. °? Arroz integral. °? Frijoles. °? Frutas y verduras frescas. °Actividad °· Reanude sus actividades normales como se lo haya indicado el médico. Pregúntele al médico qué actividades son seguras para usted. °· Descanse todo lo que pueda. Trate de descansar o tomar una siesta mientras el bebé duerme. °· No levante objetos que pesen más que su bebé o más de 10 libras (4,5 kg), como se lo haya indicado el médico. °· Pregúntele al médico cuándo puede volver a tener relaciones sexuales. Esto puede depender de lo siguiente: °? Riesgo de sufrir   infecciones. °? Velocidad de cicatrización. °? Comodidad y deseo de tener relaciones sexuales. °El baño °· No tome baños de inmersión, no nade ni use el jacuzzi hasta que el médico lo autorice.  Pregúntele al médico si puede ducharse. Tal vez solo le permitan darse baños de esponja hasta que la incisión se cure. °· Mantenga el vendaje seco, como se lo haya indicado el médico. °Instrucciones generales °· No use tampones ni se haga duchas vaginales hasta que el médico la autorice. °· Use lo siguiente: °? Ropa cómoda y suelta. °? Un sostén firme y bien ajustado. °· Controle la sangre que elimina por la vagina para detectar coágulos. Pueden tener el aspecto de grumos de color rojo oscuro, marrón o negro. °· Mantenga el perineo limpio y seco, como se lo haya indicado el médico. °· Cuando vaya al baño, siempre higienícese de adelante hacia atrás. °· Si es posible, consiga que alguien la ayude a cuidar de su bebé y con las tareas domésticas durante algunos días después de recibir el alta hospitalaria °· Concurra a todas las visitas de seguimiento para usted y el bebé, como se lo haya indicado el médico. Esto es importante. °SOLICITE ATENCIÓN MÉDICA SI: °· Tiene los siguientes síntomas: °? Una secreción vaginal con mal olor. °? Dificultad para orinar. °? Dolor al orinar. °? Aumento o disminución repentinos de la frecuencia con que defeca. °? Aumento del enrojecimiento, de la hinchazón o del dolor alrededor de la incisión. °? Aumento del líquido o de la sangre que sale de la incisión. °? Pus o mal olor en el lugar de la incisión. °? Fiebre. °? Una erupción cutánea. °? Poco interés o falta de interés en actividades que solían gustarle. °? Dudas sobre su cuidado y el del bebé. °? Náuseas. °· La incisión está caliente al tacto. °· Sus senos se ponen rojos o duros, o causan dolor. °· Siente tristeza o preocupación de forma inusual. °· Vomita. °· Elimina coágulos grandes por la vagina. Si elimina un coágulo de sangre, guárdelo para mostrárselo al médico. No elimine los coágulos sanguíneos por el inodoro sin mostrárselos a su médico. °· Orina más de lo habitual. °· Se siente mareada o se desmaya. °· No amamantó al bebé y  no tuvo su período menstrual en un período de 12 semanas posterior al parto. °· Usted dejó de amamantar al bebé y no tuvo su período menstrual en un período de 12 semanas posterior a haber dejado de amamantar. ° °SOLICITE ATENCIÓN MÉDICA DE INMEDIATO SI: °· Tiene los siguientes síntomas: °? Dolor que no desaparece o no mejora con el medicamento. °? Dolor en el pecho. °? Dificultad para respirar. °? Visión borrosa o manchas en la vista. °? Pensamientos de autolesionarse o lesionar al bebé. °? Nuevo dolor en el abdomen o en una de las piernas. °? Dolor de cabeza intenso. °· Se desmaya. °· Tiene una hemorragia tan intensa de la vagina que empapa dos toallitas sanitarias en una hora. ° °Esta información no tiene como fin reemplazar el consejo del médico. Asegúrese de hacerle al médico cualquier pregunta que tenga. °Document Released: 10/11/2005 Document Revised: 02/02/2016 Document Reviewed: 09/15/2015 °Elsevier Interactive Patient Education © 2017 Elsevier Inc. ° °

## 2017-07-07 ENCOUNTER — Telehealth: Payer: Self-pay | Admitting: General Practice

## 2017-07-07 DIAGNOSIS — Z302 Encounter for sterilization: Secondary | ICD-10-CM

## 2017-07-07 DIAGNOSIS — Z98891 History of uterine scar from previous surgery: Secondary | ICD-10-CM

## 2017-07-07 NOTE — Telephone Encounter (Signed)
Called and left message on VM for patient to arrive Monday, 07/11/17 at 1:30pm for BP check.  Interpreter was used for this call.

## 2017-07-08 ENCOUNTER — Encounter (HOSPITAL_COMMUNITY)
Admission: RE | Admit: 2017-07-08 | Discharge: 2017-07-08 | Disposition: A | Discharge status: Home / Self Care | Payer: Self-pay | Source: Ambulatory Visit | Attending: Obstetrics & Gynecology | Admitting: Obstetrics & Gynecology

## 2017-07-10 ENCOUNTER — Inpatient Hospital Stay (HOSPITAL_COMMUNITY): Admission: AD | Admit: 2017-07-10 | Payer: Self-pay | Source: Ambulatory Visit | Admitting: Obstetrics and Gynecology

## 2017-07-11 ENCOUNTER — Ambulatory Visit: Payer: Self-pay

## 2017-07-13 ENCOUNTER — Other Ambulatory Visit (HOSPITAL_COMMUNITY): Payer: Self-pay

## 2017-07-20 ENCOUNTER — Ambulatory Visit: Payer: Self-pay | Admitting: General Practice

## 2017-07-20 VITALS — BP 112/79 | HR 78 | Ht 63.0 in | Wt 190.0 lb

## 2017-07-20 DIAGNOSIS — Z0130 Encounter for examination of blood pressure without abnormal findings: Secondary | ICD-10-CM

## 2017-07-20 NOTE — Progress Notes (Signed)
Reviewed BP. Continue Norvasc and F/U AS for PP visit.   Katrinka Blazing, [place], CNM 07/20/2017 11:07 AM

## 2017-07-20 NOTE — Progress Notes (Signed)
Patient here for BP check today. Patient denies headaches, dizziness or blurry vision. Patient reports taking BP medication daily, took it last night. Discussed with Dorathy Kinsman who finds patient's BP favorable, patient may continue BP medication but should contact us if she feels lightheaded or dizzy. Informed patient. Patient verbalized understanding & had no questions

## 2017-08-15 ENCOUNTER — Encounter: Payer: Self-pay | Admitting: Advanced Practice Midwife

## 2017-08-15 ENCOUNTER — Ambulatory Visit (INDEPENDENT_AMBULATORY_CARE_PROVIDER_SITE_OTHER): Payer: Self-pay | Admitting: Advanced Practice Midwife

## 2017-08-15 NOTE — Progress Notes (Signed)
Subjective:     Roberta Marshall is a 41 y.o. female who presents for a postpartum visit. She is 6 weeks postpartum following a low cervical transverse Cesarean section. I have fully reviewed the prenatal and intrapartum course. The delivery was at 37 gestational weeks. Outcome: primary cesarean section, low transverse incision and BTL. Anesthesia: spinal. Postpartum course has been normal. Both babies' course has been normal. Baby is feeding by bottle - Similac Advance. Bleeding no bleeding. Bowel function is normal. Bladder function is normal. Patient is not sexually active. Contraception method is tubal ligation. Postpartum depression screening: negative.  The following portions of the patient's history were reviewed and updated as appropriate: allergies, current medications, past family history, past medical history, past social history, past surgical history and problem list.  Review of Systems Pertinent items noted in HPI and remainder of comprehensive ROS otherwise negative.   Objective:    There were no vitals taken for this visit.  General:  alert, cooperative and no distress   Breasts:  inspection negative, no nipple discharge or bleeding, no masses or nodularity palpable  Lungs: clear to auscultation bilaterally  Heart:  regular rate and rhythm, S1, S2 normal, no murmur, click, rub or gallop  Abdomen: soft, non-tender; bowel sounds normal; no masses,  no organomegaly   Vulva:  not evaluated  Vagina: not evaluated  Cervix:  not examined  Corpus: not examined  Adnexa:  not evaluated  Rectal Exam: Not performed.        Incision clean, dry and healing appropriately.  Assessment:     Normal postpartum exam. Pap smear not done at today's visit. Phone number for free pap smear given to patient.   Plan:    1. Contraception: tubal ligation 2. Encouraged patient to call in December for free pap smear 3. Discontinue Norvasc 4. Follow up in: 1 week for blood pressure check or as  needed.

## 2017-08-15 NOTE — Patient Instructions (Signed)
FREE PAP CLINIC: Call [phone] Tell them you would like to sign up for free pap clinic   CLINICA DE PAP GRATIS: Llamada [phone] Diles que te gustaria inscribirte en una clinica de papanicolau gratis  

## 2017-08-22 ENCOUNTER — Ambulatory Visit: Payer: Self-pay | Admitting: *Deleted

## 2017-08-22 VITALS — BP 99/67 | HR 67

## 2017-08-22 DIAGNOSIS — Z0130 Encounter for examination of blood pressure without abnormal findings: Secondary | ICD-10-CM

## 2017-08-22 NOTE — Progress Notes (Signed)
Patient presents to clinic for BP check. Has been off norvasc since visit on 10/22. BP wnl, no headaches, blurred vision or upper right abd pain. Advised pt to call with any of these symptoms. Patient voiced understanding.

## 2018-09-07 IMAGING — US US MFM OB FOLLOW-UP EACH ADDL GEST (MODIFY)
1 series · 12 of 28 positions shown · non-contrast
Comparison: none

[Series 1: us mfm ob follow-up each addl gest (modify) · 76 acquisitions, 12 frames shown]
[im 3/76]
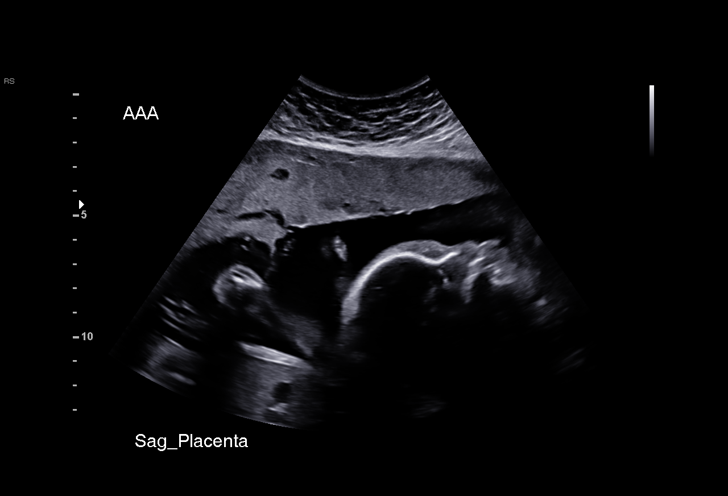
[im 9/76]
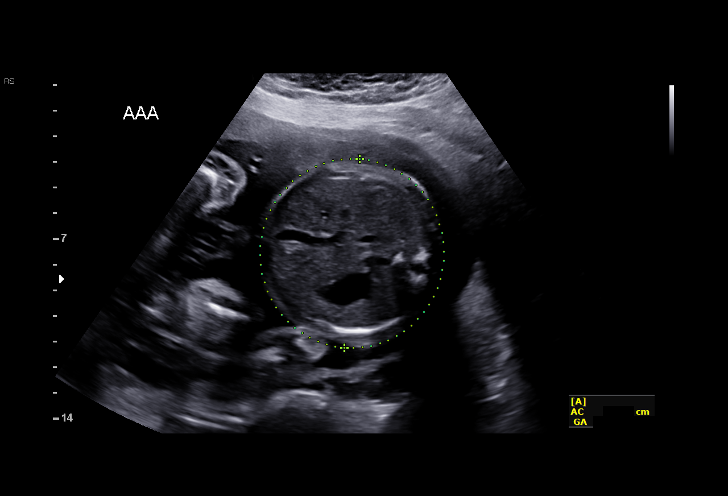
[im 14/76]
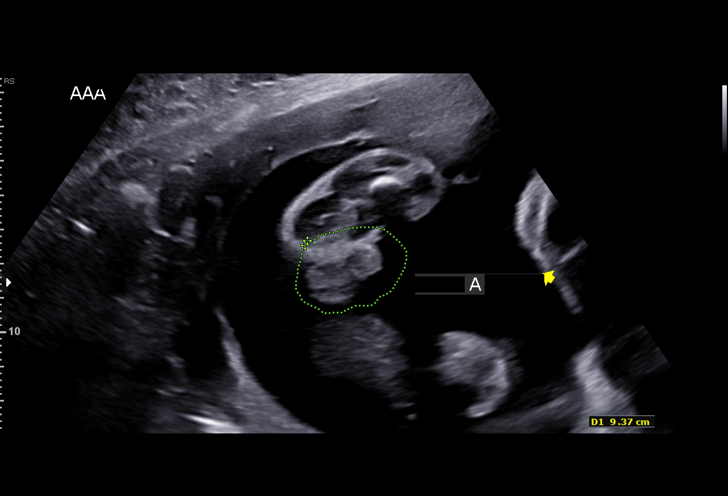
[im 23/76]
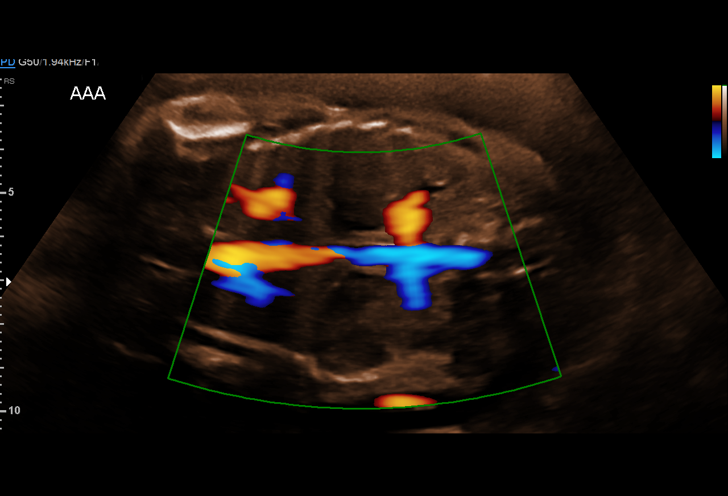
[im 28/76]
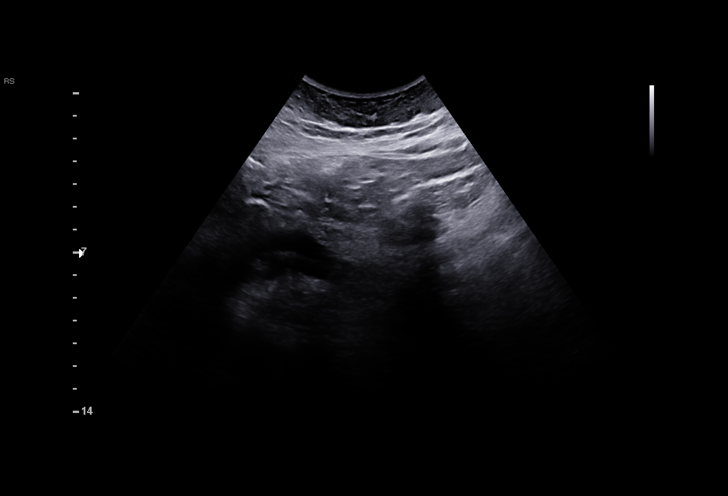
[im 34/76]
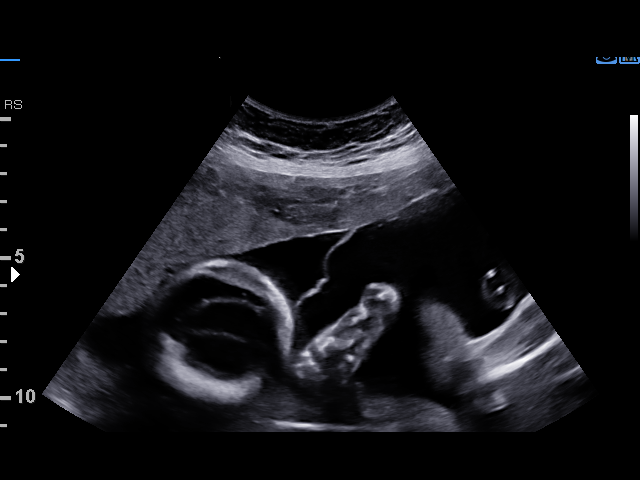
[im 42/76]
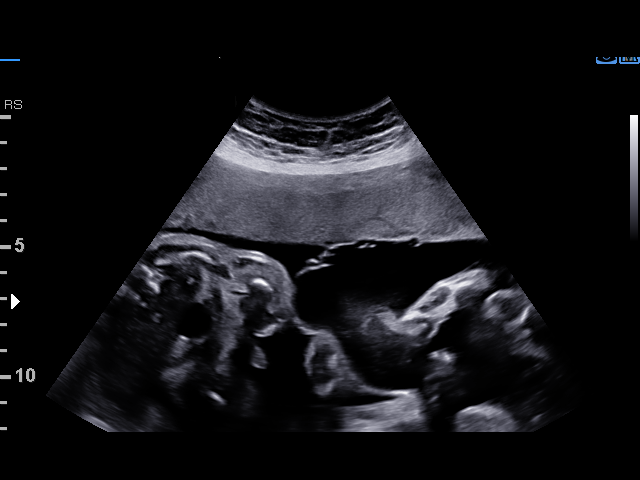
[im 48/76]
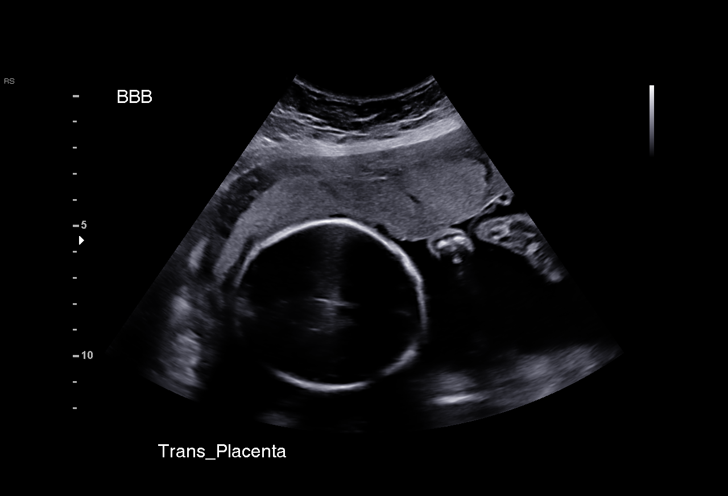
[im 53/76]
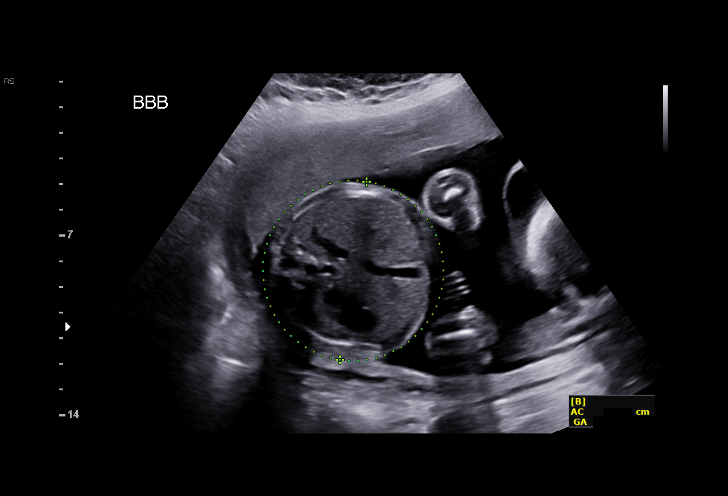
[im 62/76]
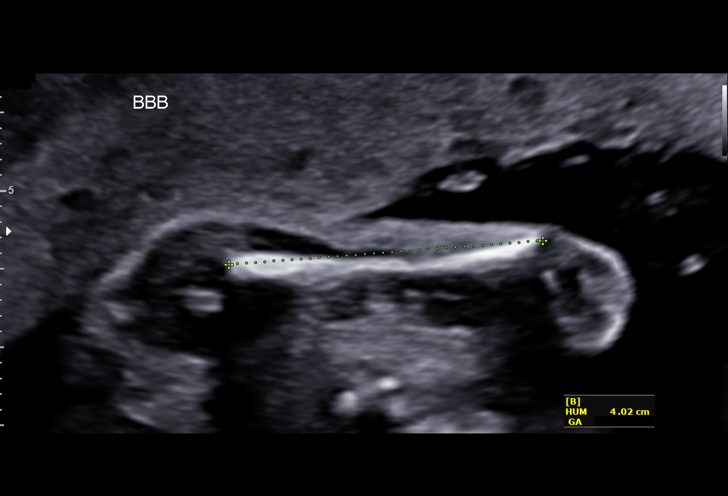
[im 67/76]
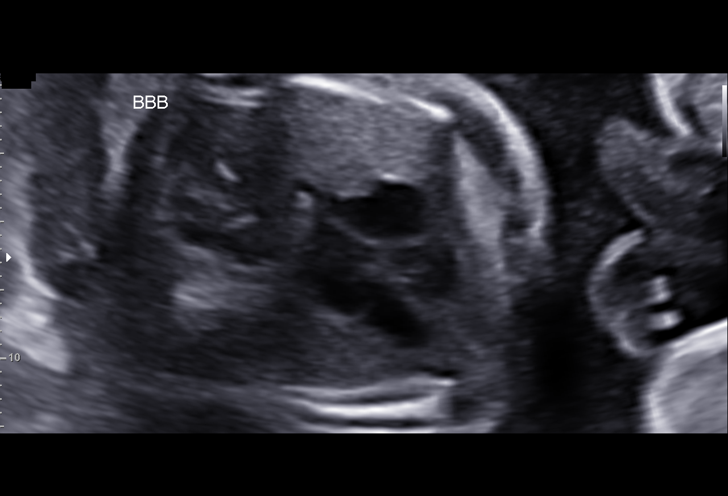
[im 73/76]
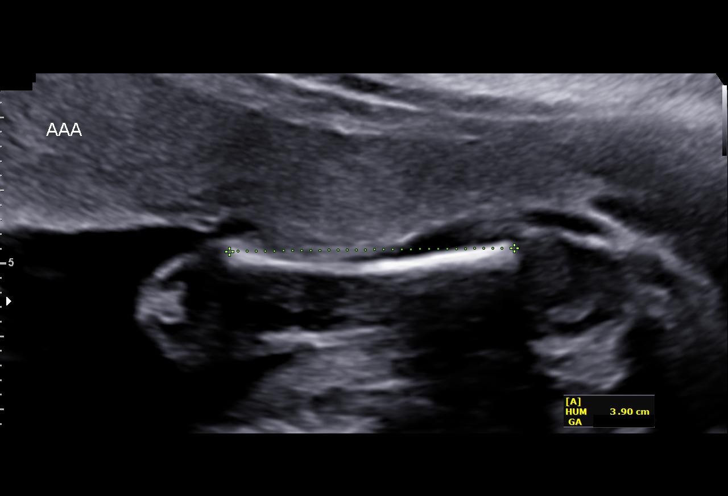

[12 of 28 positions shown; findings below may reference images not displayed]

OB/Gyn Clinic

1  ERISELDI VOLOTI            [id number]      [id number]     [id number]
2  ERISELDI VOLOTI            [id number]      [id number]     [id number]
Indications

24 weeks gestation of pregnancy
Advanced maternal age multigravida 41,
second trimester - declined testing, DAMATO
01/14/17
Twin pregnancy, di/di, second trimester
Encounter for other antenatal screening
follow-up
OB History

Blood Type:            Height:  5'4"   Weight (lb):  183      BMI:
Gravidity:    4         Term:   3
Living:       3
Fetal Evaluation (Fetus A)

Num Of Fetuses:     2
Fetal Heart         136
Rate(bpm):
Cardiac Activity:   Observed
Fetal Lie:          Lower, Right Fetus
Presentation:       Transverse, head to maternal right
Placenta:           Anterior, above cervical os
P. Cord Insertion:  Previously Visualized
Membrane Desc:      Dividing Membrane seen - Dichorionic.

Amniotic Fluid
AFI FV:      Subjectively within normal limits

Largest Pocket(cm)
6.93
Biometry (Fetus A)

BPD:        66  mm     G. Age:  26w 4d         96  %    CI:        75.54   %   70 - 86
FL/HC:      17.3   %   18.7 -
HC:      240.8  mm     G. Age:  26w 1d         83  %    HC/AC:      1.06       1.04 -
AC:      227.3  mm     G. Age:  27w 1d         97  %    FL/BPD:     63.2   %   71 - 87
FL:       41.7  mm     G. Age:  23w 4d         12  %    FL/AC:      18.3   %   20 - 24
HUM:      39.3  mm     G. Age:  24w 0d         29  %

Est. FW:     849  gm    1 lb 14 oz      75  %     FW Discordancy      0 \ 2 %
Gestational Age (Fetus A)

LMP:           24w 4d       Date:   10/16/16                 EDD:   07/23/17
U/S Today:     25w 6d                                        EDD:   07/14/17
Best:          24w 4d    Det. By:   LMP  (10/16/16)          EDD:   07/23/17
Anatomy (Fetus A)

Cranium:               Appears normal         Aortic Arch:            Previously seen
Cavum:                 Previously seen        Ductal Arch:            Previously seen
Ventricles:            Previously seen        Diaphragm:              Previously seen
Choroid Plexus:        Previously seen        Stomach:                Appears normal, left
sided
Cerebellum:            Previously seen        Abdomen:                Appears normal
Posterior Fossa:       Previously seen        Abdominal Wall:         Previously seen
Nuchal Fold:           Not applicable (>20    Cord Vessels:           Previously seen
wks GA)
Face:                  Orbits and profile     Kidneys:                Appear normal
previously seen
Lips:                  Previously seen        Bladder:                Appears normal
Thoracic:              Appears normal         Spine:                  Previously seen
Heart:                 Previously seen        Upper Extremities:      Previously seen
RVOT:                  Previously seen        Lower Extremities:      Previously seen
LVOT:                  Previously seen

Other:  Male gender. Technically difficult due to fetal position.

Fetal Evaluation (Fetus B)

Num Of Fetuses:     2
Fetal Heart         148
Rate(bpm):
Cardiac Activity:   Observed
Fetal Lie:          Upper, Left Fetus
Presentation:       Transverse, head to maternal left
Placenta:           Anterior, above cervical os
P. Cord Insertion:  Previously Visualized
Membrane Desc:      Dividing Membrane seen - Dichorionic.
Amniotic Fluid
AFI FV:      Subjectively within normal limits

Largest Pocket(cm)
6.82
Biometry (Fetus B)

BPD:      66.7  mm     G. Age:  26w 6d         97  %    CI:        79.46   %   70 - 86
FL/HC:      18.3   %   18.7 -
HC:      236.5  mm     G. Age:  25w 5d         72  %    HC/AC:      1.07       1.04 -
AC:      220.9  mm     G. Age:  26w 3d         91  %    FL/BPD:     64.8   %   71 - 87
FL:       43.2  mm     G. Age:  24w 1d         24  %    FL/AC:      19.6   %   20 - 24
HUM:      40.5  mm     G. Age:  24w 4d         43  %

Est. FW:     833  gm    1 lb 13 oz      73  %     FW Discordancy         2  %
Gestational Age (Fetus B)

LMP:           24w 4d       Date:   10/16/16                 EDD:   07/23/17
U/S Today:     25w 6d                                        EDD:   07/14/17
Best:          24w 4d    Det. By:   LMP  (10/16/16)          EDD:   07/23/17
Anatomy (Fetus B)

Cranium:               Appears normal         Aortic Arch:            Previously seen
Cavum:                 Previously seen        Ductal Arch:            Previously seen
Ventricles:            Previously seen        Diaphragm:              Previously seen
Choroid Plexus:        Previously seen        Stomach:                Appears normal, left
sided
Cerebellum:            Previously seen        Abdomen:                Appears normal
Posterior Fossa:       Previously seen        Abdominal Wall:         Previously seen
Nuchal Fold:           Not applicable (>20    Cord Vessels:           Previously seen
wks GA)
Face:                  Orbits and profile     Kidneys:                Appear normal
previously seen
Lips:                  Previously seen        Bladder:                Appears normal
Thoracic:              Appears normal         Spine:                  Previously seen
Heart:                 Previously seen        Upper Extremities:      Previously seen
RVOT:                  Previously seen        Lower Extremities:      Previously seen
LVOT:                  Previously seen

Other:  Male gender. Technically difficult due to fetal position.
Cervix Uterus Adnexa

Cervix
Length:           4.17  cm.
Normal appearance by transabdominal scan.

Uterus
No abnormality visualized.

Left Ovary
Not visualized.

Right Ovary
Within normal limits.

Adnexa:       No abnormality visualized. No adnexal mass
visualized.
Impression

Diamniotic Dichorionic intrauterine pregnancy at 24+4 weeks
Presentation is transverse/
Normal amniotic fluid in each sac, with  MVP of 6.9cm for A
and 6.8cm for B
Estimated fetal weights are 849g for A and 833g for B, 75th
and 73rd percentiles respectively. Discordance is 2%
Interval review of anatomy shows no evidence of structural
anomalies or sonographic markers for aneuploidy
Cervix is 42mm transabdominally
Recommendations

Repeat US for growth in 4 weeks

## 2020-06-25 ENCOUNTER — Other Ambulatory Visit: Payer: Self-pay

## 2020-06-25 DIAGNOSIS — Z20822 Contact with and (suspected) exposure to covid-19: Secondary | ICD-10-CM

## 2020-06-26 LAB — NOVEL CORONAVIRUS, NAA: SARS-CoV-2, NAA: NOT DETECTED

## 2020-11-12 ENCOUNTER — Encounter: Payer: Self-pay | Admitting: Advanced Practice Midwife

## 2021-11-01 ENCOUNTER — Emergency Department (HOSPITAL_COMMUNITY)
Admission: EM | Admit: 2021-11-01 | Discharge: 2021-11-01 | Disposition: A | Discharge status: Home / Self Care | Payer: Self-pay | Attending: Emergency Medicine | Admitting: Emergency Medicine

## 2021-11-01 ENCOUNTER — Emergency Department (HOSPITAL_COMMUNITY): Payer: Self-pay

## 2021-11-01 ENCOUNTER — Other Ambulatory Visit: Payer: Self-pay

## 2021-11-01 ENCOUNTER — Encounter (HOSPITAL_COMMUNITY): Payer: Self-pay | Admitting: Emergency Medicine

## 2021-11-01 DIAGNOSIS — N9489 Other specified conditions associated with female genital organs and menstrual cycle: Secondary | ICD-10-CM | POA: Insufficient documentation

## 2021-11-01 DIAGNOSIS — Z20822 Contact with and (suspected) exposure to covid-19: Secondary | ICD-10-CM | POA: Insufficient documentation

## 2021-11-01 DIAGNOSIS — J069 Acute upper respiratory infection, unspecified: Secondary | ICD-10-CM | POA: Insufficient documentation

## 2021-11-01 DIAGNOSIS — I10 Essential (primary) hypertension: Secondary | ICD-10-CM | POA: Insufficient documentation

## 2021-11-01 DIAGNOSIS — Z79899 Other long term (current) drug therapy: Secondary | ICD-10-CM | POA: Insufficient documentation

## 2021-11-01 LAB — BASIC METABOLIC PANEL
Anion gap: 12 (ref 5–15)
BUN: 6 mg/dL (ref 6–20)
CO2: 23 mmol/L (ref 22–32)
Calcium: 8.8 mg/dL — ABNORMAL LOW (ref 8.9–10.3)
Chloride: 103 mmol/L (ref 98–111)
Creatinine, Ser: 0.51 mg/dL (ref 0.44–1.00)
GFR, Estimated: >60 mL/min (ref >60)
Glucose, Bld: 83 mg/dL (ref 70–99)
Potassium: 3.9 mmol/L (ref 3.5–5.1)
Sodium: 138 mmol/L (ref 135–145)

## 2021-11-01 LAB — RESP PANEL BY RT-PCR (FLU A&B, COVID) ARPGX2
Influenza A by PCR: NEGATIVE
Influenza B by PCR: NEGATIVE
SARS Coronavirus 2 by RT PCR: NEGATIVE

## 2021-11-01 LAB — CBC WITH DIFFERENTIAL/PLATELET
Abs Immature Granulocytes: 0.02 10*3/uL (ref 0.00–0.07)
Basophils Absolute: 0 10*3/uL (ref 0.0–0.1)
Basophils Relative: 0 %
Eosinophils Absolute: 0.2 10*3/uL (ref 0.0–0.5)
Eosinophils Relative: 3 %
HCT: 39.8 % (ref 36.0–46.0)
Hemoglobin: 13.2 g/dL (ref 12.0–15.0)
Immature Granulocytes: 0 %
Lymphocytes Relative: 37 %
Lymphs Abs: 2 10*3/uL (ref 0.7–4.0)
MCH: 28 pg (ref 26.0–34.0)
MCHC: 33.2 g/dL (ref 30.0–36.0)
MCV: 84.3 fL (ref 80.0–100.0)
Monocytes Absolute: 0.5 10*3/uL (ref 0.1–1.0)
Monocytes Relative: 10 %
Neutro Abs: 2.7 10*3/uL (ref 1.7–7.7)
Neutrophils Relative %: 50 %
Platelets: 368 10*3/uL (ref 150–400)
RBC: 4.72 MIL/uL (ref 3.87–5.11)
RDW: 14.5 % (ref 11.5–15.5)
WBC: 5.4 10*3/uL (ref 4.0–10.5)
nRBC: 0 % (ref 0.0–0.2)

## 2021-11-01 LAB — I-STAT BETA HCG BLOOD, ED (MC, WL, AP ONLY): I-stat hCG, quantitative: <5 m[IU]/mL (ref <5)

## 2021-11-01 LAB — TROPONIN I (HIGH SENSITIVITY)
Troponin I (High Sensitivity): <2 ng/L (ref <18)
Troponin I (High Sensitivity): <2 ng/L (ref <18)

## 2021-11-01 LAB — D-DIMER, QUANTITATIVE: D-Dimer, Quant: 0.55 ug/mL-FEU — ABNORMAL HIGH (ref 0.00–0.50)

## 2021-11-01 MED ORDER — IOHEXOL 350 MG/ML SOLN
100.0000 mL | Freq: Once | INTRAVENOUS | Status: AC | PRN
  Administered 2021-11-01 (×2): 100 mL via INTRAVENOUS

## 2021-11-01 MED ORDER — ALBUTEROL SULFATE HFA 108 (90 BASE) MCG/ACT IN AERS: 2.0000 | INHALATION_SPRAY | RESPIRATORY_TRACT | Status: DC | PRN

## 2021-11-01 MED ORDER — ALBUTEROL SULFATE HFA 108 (90 BASE) MCG/ACT IN AERS
2.0000 | INHALATION_SPRAY | Freq: Once | RESPIRATORY_TRACT | Status: AC
  Administered 2021-11-01: 2 via RESPIRATORY_TRACT
  Filled 2021-11-01: qty 6.7

## 2021-11-01 MED ORDER — PREDNISONE 20 MG PO TABS: 20.0000 mg | ORAL_TABLET | Freq: Every day | ORAL | 0 refills | Status: AC

## 2021-11-01 NOTE — ED Triage Notes (Signed)
Using translator, Pt reports feeling SOB with exertion since Friday. States she woke up feeling tired today. Also was dizzy and bloated feeling yesterday.

## 2021-11-01 NOTE — ED Notes (Signed)
Patient discharge instructions reviewed with the patient. The patient verbalized understanding of instructions. Patient discharged. 

## 2021-11-01 NOTE — ED Provider Triage Note (Signed)
Emergency Medicine Provider Triage Evaluation Note  Roberta Marshall , a 46 y.o. female  was evaluated in triage.  Pt complains of cough, sore throat, and difficulty breathing for 3 days.  Patient states that she woke up this morning and had worsening of her symptoms, including dizziness.  She has been taking Tylenol for her symptoms with minimal relief.  Review of Systems  Positive: Cough, nasal congestion, sore throat, shortness of breath Negative: Chest pain, abdominal pain, nausea, vomiting, diarrhea, syncope  Physical Exam  BP 128/85 (BP Location: Right Arm)    Pulse 72    Temp 97.9 F (36.6 C) (Oral)    Resp 17    SpO2 100%  Gen:   Awake, no distress   Resp:  Normal effort  MSK:   Moves extremities without difficulty  Other:    Medical Decision Making  Medically screening exam initiated at 11:44 AM.  Appropriate orders placed.  Roberta Marshall was informed that the remainder of the evaluation will be completed by another provider, this initial triage assessment does not replace that evaluation, and the importance of remaining in the ED until their evaluation is complete.     Roberta Marshall T, PA-C 11/01/21 1145

## 2021-11-01 NOTE — ED Provider Notes (Signed)
[facility]   CSN: [id number] Arrival date & time: 11/01/21  1130     History  Chief Complaint  Patient presents with   Shortness of Breath    Roberta HeaterSonia Dubon Marshall is a 46 y.o. female.  Patient is a 46 year old female with no significant medical history except for hypertension while she was pregnant who is presenting today with several complaints.  Patient reports on Friday she started having headache, generalized body aches which relieved with Tylenol.  However yesterday she started feeling worse and started noticing that she was having some congestion and mild shortness of breath.  However today she notes that she is significantly short of breath with exertion and her chest feels tight.  She occasionally will have burning in her chest but reports she only has minimal cough maybe 2 or 3 times per day.  She is not having significant congestion.  She is not aware of any fever.  She has had some nausea yesterday and a full feeling in her upper abdomen but no vomiting or diarrhea.  She has had decreased appetite.  No known sick contacts.  Patient does not use tobacco products, drugs or significant alcohol.  She does not have a history of asthma or use inhalers.  She does not recall ever feeling like this before.  No history of blood clots but she reports her mom had issues with enlarged heart but is unaware if she has had a heart attack.  The history is provided by the patient. The history is limited by a language barrier. A language interpreter was used.  Shortness of Breath     Home Medications Prior to Admission medications   Medication Sig Start Date End Date Taking? Authorizing Provider  predniSONE (DELTASONE) 20 MG tablet Take 1 tablet (20 mg total) by mouth daily. 11/01/21  Yes Erine Phenix, Alphonzo LemmingsWhitney, MD  amLODipine (NORVASC) 10 MG tablet Take 1 tablet (10 mg total) by mouth daily. 07/07/17   Degele, Kandra NicolasJulie P, MD  ibuprofen (ADVIL,MOTRIN) 600  MG tablet Take 1 tablet (600 mg total) by mouth every 6 (six) hours as needed for headache, mild pain or cramping. Patient not taking: Reported on 08/15/2017 07/06/17   Degele, Kandra NicolasJulie P, MD  Prenatal Vit-Fe Fumarate-FA (MULTIVITAMIN-PRENATAL) 27-0.8 MG TABS tablet Take 1 tablet by mouth daily at 12 noon.    [provider]      Allergies    Patient has no allergy information on record.    Review of Systems   Review of Systems  Respiratory:  Positive for shortness of breath.    Physical Exam Updated Vital Signs BP 128/85 (BP Location: Right Arm)    Pulse 72    Temp 97.9 F (36.6 C) (Oral)    Resp 17    SpO2 100%  Physical Exam Vitals and nursing note reviewed.  Constitutional:      General: She is not in acute distress.    Appearance: She is well-developed.  HENT:     Head: Normocephalic and atraumatic.     Right Ear: Tympanic membrane normal.     Left Ear: Tympanic membrane normal.     Nose: Congestion present.     Mouth/Throat:     Mouth: Mucous membranes are dry.  Eyes:     Pupils: Pupils are equal, round, and reactive to light.  Cardiovascular:     Rate and Rhythm: Normal rate and regular rhythm.     Heart sounds: Normal heart sounds. No murmur  heard.   No friction rub.  Pulmonary:     Effort: Pulmonary effort is normal.     Breath sounds: Normal breath sounds. No wheezing or rales.     Comments: Mild decreased breath sounds but no wheezing noted Abdominal:     General: Bowel sounds are normal. There is no distension.     Palpations: Abdomen is soft.     Tenderness: There is no abdominal tenderness. There is no guarding or rebound.  Musculoskeletal:        General: No tenderness. Normal range of motion.     Comments: No edema  Skin:    General: Skin is warm and dry.     Findings: No rash.  Neurological:     Mental Status: She is alert and oriented to person, place, and time. Mental status is at baseline.     Cranial Nerves: No cranial nerve deficit.   Psychiatric:        Mood and Affect: Mood normal.        Behavior: Behavior normal.    ED Results / Procedures / Treatments   Labs (all labs ordered are listed, but only abnormal results are displayed) Labs Reviewed  BASIC METABOLIC PANEL - Abnormal; Notable for the following components:      Result Value   Calcium 8.8 (*)    All other components within normal limits  D-DIMER, QUANTITATIVE - Abnormal; Notable for the following components:   D-Dimer, Quant 0.55 (*)    All other components within normal limits  RESP PANEL BY RT-PCR (FLU A&B, COVID) ARPGX2  CBC WITH DIFFERENTIAL/PLATELET  I-STAT BETA HCG BLOOD, ED ([facility], [facility], [facility] ONLY)  TROPONIN I (HIGH SENSITIVITY)  TROPONIN I (HIGH SENSITIVITY)    EKG EKG Interpretation  Date/Time:  Sunday November 01 2021 11:44:09 EST Ventricular Rate:  72 PR Interval:  146 QRS Duration: 78 QT Interval:  398 QTC Calculation: 435 R Axis:   51 Text Interpretation: Normal sinus rhythm Nonspecific T wave abnormality No previous ECGs available Confirmed by Gwyneth Sprout ([id number]) on 11/01/2021 3:02:17 PM  Radiology DG Chest 2 View  Result Date: 11/01/2021 CLINICAL DATA:  Cough, sore throat and difficulty breathing for 3 days. EXAM: CHEST - 2 VIEW COMPARISON:  12/05/2014. FINDINGS: Normal heart, mediastinum and hila. Clear lungs.  No pleural effusion or pneumothorax. Skeletal structures are unremarkable. IMPRESSION: No active cardiopulmonary disease. Electronically Signed   By: Amie Portland M.D.   On: 11/01/2021 12:02   CT Angio Chest PE W and/or Wo Contrast  Result Date: 11/01/2021 CLINICAL DATA:  Rule out pulmonary embolism.  Positive D-dimer. EXAM: CT ANGIOGRAPHY CHEST WITH CONTRAST TECHNIQUE: Multidetector CT imaging of the chest was performed using the standard protocol during bolus administration of intravenous contrast. Multiplanar CT image reconstructions and MIPs were obtained to evaluate the vascular anatomy. CONTRAST:  100 cc of Omnipaque  350 COMPARISON:  None. FINDINGS: Cardiovascular: Satisfactory opacification of the pulmonary arteries to the segmental level. No evidence of pulmonary embolism. Mild cardiac enlargement.  No pericardial effusion. Mediastinum/Nodes: No enlarged mediastinal, hilar, or axillary lymph nodes. Thyroid gland, trachea, and esophagus demonstrate no significant findings. Lungs/Pleura: No pleural effusion or airspace consolidation. No pneumothorax or atelectasis identified. No suspicious lung nodules. Upper Abdomen: No acute abnormality. Musculoskeletal: No chest wall abnormality. No acute or significant osseous findings. Review of the MIP images confirms the above findings. IMPRESSION: 1. No evidence for acute pulmonary embolus. 2. Mild cardiac enlargement. Electronically Signed   By: Veronda Prude.D.  On: 11/01/2021 17:37    Procedures Procedures    Medications Ordered in ED Medications  albuterol (VENTOLIN HFA) 108 (90 Base) MCG/ACT inhaler 2 puff (2 puffs Inhalation Given 11/01/21 1543)  iohexol (OMNIPAQUE) 350 MG/ML injection 100 mL (100 mLs Intravenous Contrast Given 11/01/21 1731)    ED Course/ Medical Decision Making/ A&P                           Medical Decision Making Amount and/or Complexity of Data Reviewed Labs: ordered. Decision-making details documented in ED Course. Radiology: ordered and independent interpretation performed. Decision-making details documented in ED Course. ECG/medicine tests: ordered and independent interpretation performed. Decision-making details documented in ED Course.   Patient is a 46 year old healthy female presenting today with complaints of some URI type symptoms but most significantly exertional dyspnea.  She has no history of asthma and on exam does not have any wheezing but slight decreased breath sounds.  She has had minimal cough and congestion.  Patient's symptoms could be related to a viral illness however given no fever, significant cough or sputum  production also concern for possible PE.  Patient is low risk Wells criteria and is a candidate for a D-dimer.  Also patient had an EKG done while in triage and based on my interpretation of the EKG there are some abnormalities.  She does have T wave inversions inferiorly without old to compare.  I also independently reviewed and interpreted patient's chest x-ray which is normal.  Radiology with similar read.  Patient's COVID and flu test are negative.  Suspicion for different viral URI versus PE versus atypical ACS versus reactive airways.  Patient given albuterol inhaler.  We will do a troponin and D-dimer for further evaluation.  6:22 PM The trop was wnl.  D-dimer is elevated at .55.  pt went for CTA and I independently reviewed CTA read from radiology and no PE today but mild cardiomegaly.  Pt improved after albuterol.  Will treat for a viral illness.  Pt given return precautions.  Findings discussed with the pt and her son and questions answered.  Language interpreter used.        Final Clinical Impression(s) / ED Diagnoses Final diagnoses:  Viral upper respiratory tract infection    Rx / DC Orders ED Discharge Orders          Ordered    predniSONE (DELTASONE) 20 MG tablet  Daily        11/01/21 1821              Gwyneth Sprout, MD 11/01/21 1827

## 2021-11-01 NOTE — Discharge Instructions (Signed)
No signs of heart problems or blood clots today.  Use the inhaler every 4-6 hours 2 puffs as needed for shortness of breath.

## 2021-11-04 DIAGNOSIS — R0602 Shortness of breath: Secondary | ICD-10-CM | POA: Insufficient documentation

## 2021-11-04 NOTE — Progress Notes (Signed)
Cardiology Office Note   Date:  11/05/2021   ID:  Alylah, Blakney May 06, 1976, MRN [id number]  PCP:  Adam Phenix, MD  Cardiologist:   None Referring:  ED  Chief Complaint  Patient presents with   Shortness of Breath      History of Present Illness: Arnett Galindez is a 46 y.o. female who is referred by Adam Phenix, MD for evaluation of shortness of breath.     She did recently have a CT that demonstrated no acute pulmonary embolism although there was suggestion of mild cardiac enlargement.  This was done when she was in the emergency room last week with shortness of breath.  She presented to an urgent care and I reviewed these records for this visit.  She had a tightness in her chest.  She had difficulty taking a deep breath.  She was very tired.  She takes care of her children which are 45-year-old twins and a 29-year-old.  She has been noticing more breathlessness with doing that activity.  She gets some vague chest discomfort but it is hard for her to quantify and qualify.  We did perform this entire visit through an interpreter.  She is not routinely describing substernal pressure, neck or arm discomfort which she does have different aches and pains.  Her daughter who is with her says she has had a lot of stress.  She does not exercise routinely but she takes care of the household chores.  She does not describe PND or orthopnea.  She really does not notice any palpitations, presyncope or syncope.  Of note she did have a mildly elevated D-dimer.  She was sent home with albuterol and prednisone.   Past Medical History:  Diagnosis Date   Medical history non-contributory    Varicose veins of both legs with edema     Past Surgical History:  Procedure Laterality Date   CESAREAN SECTION MULTI-GESTATIONAL N/A 07/03/2017   Procedure: CESAREAN SECTION MULTI-GESTATIONAL;  Surgeon: [place] Bing, MD;  Location: [facility];  Service: Obstetrics;  Laterality: N/A;    NO PAST SURGERIES       Current Outpatient Medications  Medication Sig Dispense Refill   albuterol (VENTOLIN HFA) 108 (90 Base) MCG/ACT inhaler Inhale into the lungs every 6 (six) hours as needed for wheezing or shortness of breath.     predniSONE (DELTASONE) 20 MG tablet Take 1 tablet (20 mg total) by mouth daily. 5 tablet 0   amLODipine (NORVASC) 10 MG tablet Take 1 tablet (10 mg total) by mouth daily. (Patient not taking: Reported on 11/05/2021) 30 tablet 1   ibuprofen (ADVIL,MOTRIN) 600 MG tablet Take 1 tablet (600 mg total) by mouth every 6 (six) hours as needed for headache, mild pain or cramping. (Patient not taking: Reported on 08/15/2017) 30 tablet 0   Prenatal Vit-Fe Fumarate-FA (MULTIVITAMIN-PRENATAL) 27-0.8 MG TABS tablet Take 1 tablet by mouth daily at 12 noon. (Patient not taking: Reported on 11/05/2021)     No current facility-administered medications for this visit.    Allergies:   Patient has no allergy information on record.    Social History:  The patient  reports that she has never smoked. She has never used smokeless tobacco. She reports that she does not drink alcohol and does not use drugs.   Family History:  The patient's family history includes Heart disease in her mother.    ROS:  Please see the history of present illness.   Otherwise, review  of systems are positive for none.   All other systems are reviewed and negative.    PHYSICAL EXAM: VS:  BP 131/87    Pulse 67    Ht 5' 3.5" (1.613 m)    Wt 211 lb (95.7 kg)    SpO2 100%    BMI 36.79 kg/m  , BMI Body mass index is 36.79 kg/m. GENERAL:  Well appearing HEENT:  Pupils equal round and reactive, fundi not visualized, oral mucosa unremarkable NECK:  No jugular venous distention, waveform within normal limits, carotid upstroke brisk and symmetric, no bruits, no thyromegaly LYMPHATICS:  No cervical, inguinal adenopathy LUNGS:  Clear to auscultation bilaterally BACK:  No CVA tenderness CHEST:   Unremarkable HEART:  PMI not displaced or sustained,S1 and S2 within normal limits, no S3, no S4, no clicks, no rubs, no murmurs ABD:  Flat, positive bowel sounds normal in frequency in pitch, no bruits, no rebound, no guarding, no midline pulsatile mass, no hepatomegaly, no splenomegaly EXT:  2 plus pulses throughout, no edema, no cyanosis no clubbing SKIN:  No rashes no nodules NEURO:  Cranial nerves II through XII grossly intact, motor grossly intact throughout PSYCH:  Cognitively intact, oriented to person place and time    EKG:  EKG is ordered today. The ekg ordered today demonstrates sinus rhythm, rate 67, axis within normal limits, intervals within normal limits, nonspecific inferior T wave changes not changed from the recent EKG.   Recent Labs: 11/01/2021: BUN 6; Creatinine, Ser 0.51; Hemoglobin 13.2; Platelets 368; Potassium 3.9; Sodium 138    Lipid Panel No results found for: CHOL, TRIG, HDL, CHOLHDL, VLDL, LDLCALC, LDLDIRECT    Wt Readings from Last 3 Encounters:  11/05/21 211 lb (95.7 kg)  08/15/17 193 lb 1.6 oz (87.6 kg)  07/20/17 190 lb (86.2 kg)      Other studies Reviewed: Additional studies/ records that were reviewed today include: ED records. Review of the above records demonstrates:  Please see elsewhere in the note.     ASSESSMENT AND PLAN:  Shortness of breath:    She has some shortness of breath that was thought to be probably pulmonary related urgent care but there was a question of cardiomegaly.  I do not think she has overt heart failure.  I am going to check however an echocardiogram.  This will also evaluate pulmonary pressures which were suggested to possibly be elevated based on the CT as above.  Chest pain: She has a vague history of chest discomfort.  Her mom died of some heart issue in [place] and there were no details.  Her father was killed at a very young age.  There are no other family history but there is some apparent family history.   Given that I think screening with a POET (Plain Old Exercise Treadmill) is warranted.   Current medicines are reviewed at length with the patient today.  The patient does not have concerns regarding medicines.  The following changes have been made:  no change  Labs/ tests ordered today include:   Orders Placed This Encounter  Procedures   EXERCISE TOLERANCE TEST (ETT)   EKG 12-Lead   ECHOCARDIOGRAM COMPLETE     Disposition:   FU with me as needed   Signed, Rollene Rotunda, MD  11/05/2021 9:59 AM    [facility]

## 2021-11-05 ENCOUNTER — Ambulatory Visit (INDEPENDENT_AMBULATORY_CARE_PROVIDER_SITE_OTHER): Payer: Self-pay | Admitting: Cardiology

## 2021-11-05 ENCOUNTER — Other Ambulatory Visit: Payer: Self-pay

## 2021-11-05 ENCOUNTER — Encounter: Payer: Self-pay | Admitting: Cardiology

## 2021-11-05 VITALS — BP 131/87 | HR 67 | Ht 63.5 in | Wt 211.0 lb

## 2021-11-05 DIAGNOSIS — I517 Cardiomegaly: Secondary | ICD-10-CM

## 2021-11-05 DIAGNOSIS — R0602 Shortness of breath: Secondary | ICD-10-CM

## 2021-11-05 NOTE — Patient Instructions (Signed)
Medication Instructions:  Your physician recommends that you continue on your current medications as directed. Please refer to the Current Medication list given to you today.  *If you need a refill on your cardiac medications before your next appointment, please call your pharmacy*   Testing/Procedures: Echocardiogram to be done at [facility] @ [address] 3[address] Floor [place]  Exercise Tolerance Test done at [facility] @ [address] [place]  -- Dr. Jenene Slicker office  [employer] Exercise Stress Test Una ergometra es una prueba para verificar cmo funciona su corazn durante el ejercicio. En [employer], deber caminar sobre una cinta caminadora o usar una bicicleta fija. Un electrocardiograma (ECG) registrar los latidos cardacos cuando est en reposo y cuando haga ejercicio. Es posible que se le realice una ecografa o una prueba nuclear despus de la prueba de esfuerzo. La prueba se realiza para detectar si tiene una arteriopata coronaria [facility]). Tambin se realiza para: Observar qu tan bien puede hacer ejercicio. Determinar si tiene presin arterial alta durante el ejercicio. Analizar qu tan bien puede hacer ejercicio despus del tratamiento. Comprobar el flujo de sangre que llega a sus brazos y piernas. Si el resultado de la prueba no es normal, pueden necesitarse ms pruebas. Qu ocurre antes del procedimiento? Siga las instrucciones del mdico respecto de lo que no puede comer o beber. No consuma bebidas ni alimentos con cafena durante las 24 horas previas a la prueba, o segn las instrucciones de su mdico. Esto incluye caf, t (incluso t descafeinado), gaseosas, chocolate y cacao. Consulte al mdico si debe cambiar o suspender sus medicamentos habituales. Esto es importante si: Toma medicamentos para la diabetes. Toma betabloqueantes. [place] un parche de nitroglicerina. Si [place] un inhalador, Nurse, mental health momento del [place]. No se aplique  lociones, talcos, cremas ni aceites en el pecho antes de la prueba. Use ropa y calzado cmodos. No consuma ningn producto que contenga nicotina o tabaco, como cigarrillos y Administrator, Civil Service. Deje de usarlos por lo menos 4 horas antes del estudio. Si necesita ayuda para dejar de fumar, consulte al mdico. Ladell Heads ocurre durante el procedimiento?  Le colocarn parches (electrodos) en el pecho. Se conectarn cables a los parches. Los cables enviarn seales a una mquina para Arboriculturist latidos cardacos. Se controlar su frecuencia cardaca mientras est en reposo y mientras haga ejercicio. Tambin observarn su presin arterial durante la prueba. Caminar sobre una cinta caminadora o usar una bicicleta fija. Si no puede usar estos equipos para Materials engineer, se le puede pedir que gire una manivela con las [place]. La actividad se volver ms difcil y aumentar su frecuencia cardaca. Es posible que se le pida que respire en un tubo varias veces durante la prueba. Esto se hace para medir los gases que expira. Se le preguntar cmo se siente durante la prueba. Har ejercicio hasta que su corazn alcance la frecuencia cardaca objetivo. Deber detenerse antes si: Siente mareos. Siente dolor en el pecho. Le falta el aire. Su presin arterial es demasiado alta o demasiado baja. Tiene latidos cardacos irregulares. Siente dolor o [employer] brazos o en las piernas. Este procedimiento puede variar segn el mdico y el hospital. [place] Heads ocurre despus del procedimiento? Controlarn su presin arterial, frecuencia cardaca y respiratoria, y el nivel de oxgeno en su sangre. Puede regresar a sus actividades segn las indicaciones del mdico. Es su responsabilidad retirar los resultados del examen. Pregntele al mdico, o al departamento que realiza la prueba, cundo estarn [employer]  resultados. Resumen Una ergometra es una prueba para verificar cmo funciona su corazn durante el  ejercicio. Esta prueba se realiza para detectar una arteriopata coronaria. Se controlar su frecuencia cardaca mientras est en reposo y mientras haga ejercicio. Siga las instrucciones del mdico respecto de lo que no puede comer o beber antes del [place]. Esta informacin no tiene Theme park manager el consejo del mdico. Asegrese de hacerle al mdico cualquier pregunta que tenga. Document Revised: 08/08/2020 Document Reviewed: 08/08/2020 Elsevier Patient Education  2022 Elsevier Inc.    Follow-Up: At [facility], you and your health needs are our priority.  As part of our continuing mission to provide you with exceptional heart care, we have created designated Provider Care Teams.  These Care Teams include your primary Cardiologist (physician) and Advanced Practice Providers (APPs -  Physician Assistants and Nurse Practitioners) who all work together to provide you with the care you need, when you need it.  We recommend signing up for the patient portal called "MyChart".  Sign up information is provided on this After Visit Summary.  MyChart is used to connect with patients for Virtual Visits (Telemedicine).  Patients are able to view lab/test results, encounter notes, upcoming appointments, etc.  Non-urgent messages can be sent to your provider as well.   To learn more about what you can do with MyChart, go to [email or web address].    Your next appointment:   AS NEEDED with Dr. Antoine Poche

## 2021-11-06 ENCOUNTER — Telehealth (HOSPITAL_COMMUNITY): Payer: Self-pay | Admitting: *Deleted

## 2021-11-06 ENCOUNTER — Telehealth: Payer: Self-pay

## 2021-11-06 DIAGNOSIS — R079 Chest pain, unspecified: Secondary | ICD-10-CM

## 2021-11-06 NOTE — Telephone Encounter (Signed)
Close encounter 

## 2021-11-10 ENCOUNTER — Other Ambulatory Visit: Payer: Self-pay

## 2021-11-10 ENCOUNTER — Encounter (HOSPITAL_COMMUNITY): Payer: Self-pay | Admitting: *Deleted

## 2021-11-10 ENCOUNTER — Ambulatory Visit (HOSPITAL_COMMUNITY)
Admission: RE | Admit: 2021-11-10 | Discharge: 2021-11-10 | Disposition: A | Discharge status: Home / Self Care | Payer: Self-pay | Source: Ambulatory Visit | Attending: Internal Medicine | Admitting: Internal Medicine

## 2021-11-10 DIAGNOSIS — R0602 Shortness of breath: Secondary | ICD-10-CM | POA: Insufficient documentation

## 2021-11-10 LAB — EXERCISE TOLERANCE TEST
Base ST Depression (mm): 0 mm
Estimated workload: 10.4
Exercise duration (min): 9 min
Exercise duration (sec): 12 s
MPHR: 177 {beats}/min
Peak HR: 184 {beats}/min
Percent HR: 103 %
Rest HR: 68 {beats}/min
ST Depression (mm): 1 mm

## 2021-11-10 NOTE — Progress Notes (Unsigned)
Abnormal ETT was reviewed by Dr. Harl Bowie. Patient was given the ok to be discharged. Patient will have follow up with Dr. Percival Spanish.

## 2021-11-16 ENCOUNTER — Other Ambulatory Visit: Payer: Self-pay | Admitting: *Deleted

## 2021-11-16 DIAGNOSIS — R9439 Abnormal result of other cardiovascular function study: Secondary | ICD-10-CM

## 2021-11-16 MED ORDER — METOPROLOL TARTRATE 100 MG PO TABS: ORAL_TABLET | ORAL | 0 refills | Status: AC

## 2021-11-18 ENCOUNTER — Other Ambulatory Visit: Payer: Self-pay

## 2021-11-18 ENCOUNTER — Ambulatory Visit (HOSPITAL_COMMUNITY): Payer: Self-pay | Attending: Cardiovascular Disease

## 2021-11-18 DIAGNOSIS — R0602 Shortness of breath: Secondary | ICD-10-CM | POA: Insufficient documentation

## 2021-11-18 DIAGNOSIS — I517 Cardiomegaly: Secondary | ICD-10-CM | POA: Insufficient documentation

## 2021-11-18 LAB — ECHOCARDIOGRAM COMPLETE
Area-P 1/2: 3.91 cm2
S' Lateral: 3.9 cm

## 2021-11-18 NOTE — Telephone Encounter (Signed)
Opened in error

## 2021-12-03 ENCOUNTER — Telehealth: Payer: Self-pay | Admitting: Cardiology

## 2021-12-03 NOTE — Telephone Encounter (Signed)
°  Patient would like a call regarding her echo results

## 2021-12-03 NOTE — Telephone Encounter (Signed)
Minus Breeding, MD  11/18/2021  8:29 PM EST     Normal echo and no valvular disease.   Call Ms. Gwendolyn Lima with the results and send results to Woodroe Mode, MD     The patient has been notified of the result and verbalized understanding, via [employer] ([employer]-ID#[id number]).  All questions (if any) were answered. Nuala Alpha, LPN [id number] [phone]   Will route copy of echo report to pts PCP Dr. Roselie Awkward, via epic.

## 2022-02-07 ENCOUNTER — Encounter (HOSPITAL_COMMUNITY): Payer: Self-pay

## 2023-02-09 ENCOUNTER — Encounter (HOSPITAL_COMMUNITY): Payer: Self-pay

## 2023-02-09 ENCOUNTER — Emergency Department (HOSPITAL_COMMUNITY)
Admission: EM | Admit: 2023-02-09 | Discharge: 2023-02-09 | Disposition: A | Discharge status: Home / Self Care | Payer: Self-pay | Attending: Emergency Medicine | Admitting: Emergency Medicine

## 2023-02-09 ENCOUNTER — Emergency Department (HOSPITAL_COMMUNITY): Payer: Self-pay

## 2023-02-09 ENCOUNTER — Other Ambulatory Visit: Payer: Self-pay

## 2023-02-09 DIAGNOSIS — R0602 Shortness of breath: Secondary | ICD-10-CM | POA: Insufficient documentation

## 2023-02-09 DIAGNOSIS — R0789 Other chest pain: Secondary | ICD-10-CM | POA: Insufficient documentation

## 2023-02-09 LAB — CBC
HCT: 39.2 % (ref 36.0–46.0)
Hemoglobin: 12.6 g/dL (ref 12.0–15.0)
MCH: 27.1 pg (ref 26.0–34.0)
MCHC: 32.1 g/dL (ref 30.0–36.0)
MCV: 84.3 fL (ref 80.0–100.0)
Platelets: 396 10*3/uL (ref 150–400)
RBC: 4.65 MIL/uL (ref 3.87–5.11)
RDW: 14.4 % (ref 11.5–15.5)
WBC: 9 10*3/uL (ref 4.0–10.5)
nRBC: 0 % (ref 0.0–0.2)

## 2023-02-09 LAB — BASIC METABOLIC PANEL
Anion gap: 11 (ref 5–15)
BUN: 8 mg/dL (ref 6–20)
CO2: 22 mmol/L (ref 22–32)
Calcium: 9.1 mg/dL (ref 8.9–10.3)
Chloride: 103 mmol/L (ref 98–111)
Creatinine, Ser: 0.51 mg/dL (ref 0.44–1.00)
GFR, Estimated: >60 mL/min (ref >60)
Glucose, Bld: 86 mg/dL (ref 70–99)
Potassium: 4.2 mmol/L (ref 3.5–5.1)
Sodium: 136 mmol/L (ref 135–145)

## 2023-02-09 LAB — TROPONIN I (HIGH SENSITIVITY)
Troponin I (High Sensitivity): <2 ng/L (ref <18)
Troponin I (High Sensitivity): <2 ng/L (ref <18)

## 2023-02-09 MED ORDER — ASPIRIN 81 MG PO CHEW
324.0000 mg | CHEWABLE_TABLET | Freq: Once | ORAL | Status: AC
  Administered 2023-02-09: 324 mg via ORAL
  Filled 2023-02-09: qty 4

## 2023-02-09 MED ORDER — KETOROLAC TROMETHAMINE 15 MG/ML IJ SOLN
15.0000 mg | Freq: Once | INTRAMUSCULAR | Status: AC
  Administered 2023-02-09: 15 mg via INTRAVENOUS
  Filled 2023-02-09: qty 1

## 2023-02-09 MED ORDER — MORPHINE SULFATE (PF) 4 MG/ML IV SOLN
4.0000 mg | Freq: Once | INTRAVENOUS | Status: AC
  Administered 2023-02-09: 4 mg via INTRAVENOUS
  Filled 2023-02-09: qty 1

## 2023-02-09 NOTE — ED Provider Notes (Signed)
[place] [facility] AT [facility] Provider Note   CSN: [id number] Arrival date & time: 02/09/23  1924     History  Chief Complaint  Patient presents with   Chest Pain    Roberta Marshall is a 47 y.o. female.  With a history of preeclampsia, varicose veins who presents to the ED for evaluation of sudden onset left-sided chest pain.  This began approximately 4 hours prior to arrival.  Pain is intermittent and lasts a few seconds at a time.  Occurs approximately once per minute.  Pain does not radiate.  It is worsened with deep inspiration. she states that approximately 1 hour after symptom onset she developed some shortness of breath with talking and exertion.  She states she was sick approximately 1 week ago with a sore throat.  She reports an intermittent cough but denies productivity.  Denies hemoptysis, unilateral leg swelling, history of DVT or PE, birth control use, recent travel, surgeries, cancer treatments.  Denies history of similar symptoms.  She denies associated nausea, vomiting, diaphoresis.  Pain is reproducible with deep inspirations.  It is not exertional.   History is provided by the patient with use of Spanish interpreter.   Chest Pain Associated symptoms: shortness of breath        Home Medications Prior to Admission medications   Medication Sig Start Date End Date Taking? Authorizing Provider  EXCEDRIN MIGRAINE [phone] MG tablet Take 1 tablet by mouth every 6 (six) hours as needed for headache or migraine.   Yes [provider]  albuterol (VENTOLIN HFA) 108 (90 Base) MCG/ACT inhaler Inhale 1-2 puffs into the lungs every 6 (six) hours as needed for wheezing or shortness of breath.    [provider]  amLODipine (NORVASC) 10 MG tablet Take 1 tablet (10 mg total) by mouth daily. Patient not taking: Reported on 02/09/2023 07/07/17   Degele, Kandra Nicolas, MD  ibuprofen (ADVIL,MOTRIN) 600 MG tablet Take 1 tablet (600 mg total) by  mouth every 6 (six) hours as needed for headache, mild pain or cramping. Patient not taking: Reported on 02/09/2023 07/06/17   Degele, Kandra Nicolas, MD  metoprolol tartrate (LOPRESSOR) 100 MG tablet TAKE 2 HOURS PRIOR TO CT SCAN Patient not taking: Reported on 02/09/2023 11/16/21   Rollene Rotunda, MD  predniSONE (DELTASONE) 20 MG tablet Take 1 tablet (20 mg total) by mouth daily. Patient not taking: Reported on 02/09/2023 11/01/21   Gwyneth Sprout, MD      Allergies    Patient has no known allergies.    Review of Systems   Review of Systems  Respiratory:  Positive for shortness of breath.   Cardiovascular:  Positive for chest pain.  All other systems reviewed and are negative.   Physical Exam Updated Vital Signs BP 106/68   Pulse 71   Temp 98.2 F (36.8 C) (Oral)   Resp 10   Ht 5\' 3"  (1.6 m)   Wt 99.8 kg   LMP 02/02/2023   SpO2 99%   BMI 38.97 kg/m  Physical Exam Vitals and nursing note reviewed.  Constitutional:      General: She is in acute distress.     Appearance: She is well-developed. She is not ill-appearing, toxic-appearing or diaphoretic.     Comments: Resting on the bed, mild distress with intermittent episodes of pain.  HENT:     Head: Normocephalic and atraumatic.  Eyes:     Conjunctiva/sclera: Conjunctivae normal.  Cardiovascular:     Rate and Rhythm:  Normal rate and regular rhythm.     Heart sounds: No murmur heard. Pulmonary:     Effort: Pulmonary effort is normal. No respiratory distress.     Breath sounds: Normal breath sounds. No decreased breath sounds, wheezing, rhonchi or rales.  Abdominal:     Palpations: Abdomen is soft.     Tenderness: There is no abdominal tenderness.  Musculoskeletal:        General: No swelling.     Cervical back: Neck supple.     Right lower leg: No edema.     Left lower leg: No edema.  Skin:    General: Skin is warm and dry.     Capillary Refill: Capillary refill takes less than 2 seconds.  Neurological:     General: No  focal deficit present.     Mental Status: She is alert and oriented to person, place, and time.  Psychiatric:        Mood and Affect: Mood normal.     ED Results / Procedures / Treatments   Labs (all labs ordered are listed, but only abnormal results are displayed) Labs Reviewed  BASIC METABOLIC PANEL  CBC  TROPONIN I (HIGH SENSITIVITY)  TROPONIN I (HIGH SENSITIVITY)    EKG EKG Interpretation  Date/Time:  Wednesday February 09 2023 19:23:51 EDT Ventricular Rate:  78 PR Interval:  150 QRS Duration: 76 QT Interval:  364 QTC Calculation: 414 R Axis:   56 Text Interpretation: Normal sinus rhythm Cannot rule out Anterior infarct , age undetermined T wave abnormality, consider inferior ischemia  no significant change since Jan 2023 Confirmed by Pricilla Loveless [phone]) on 02/09/2023 8:24:19 PM  Radiology DG Chest 1 View  Result Date: 02/09/2023 CLINICAL DATA:  Chest pain and shortness of breath. EXAM: CHEST  1 VIEW COMPARISON:  Radiograph and CT 11/01/2021 FINDINGS: Stable heart size.The cardiomediastinal contours are normal. Incidental azygos fissure. Pulmonary vasculature is normal. No consolidation, pleural effusion, or pneumothorax. No acute osseous abnormalities are seen. IMPRESSION: No acute chest findings. Electronically Signed   By: Narda Rutherford M.D.   On: 02/09/2023 20:08    Procedures Procedures    Medications Ordered in ED Medications  ketorolac (TORADOL) 15 MG/ML injection 15 mg (has no administration in time range)  aspirin chewable tablet 324 mg (324 mg Oral Given 02/09/23 1953)  morphine (PF) 4 MG/ML injection 4 mg (4 mg Intravenous Given 02/09/23 2127)    ED Course/ Medical Decision Making/ A&P             HEART Score: 3                Medical Decision Making Amount and/or Complexity of Data Reviewed Radiology: ordered.  Risk Prescription drug management.  This patient presents to the ED for concern of chest pain, this involves an extensive number of  treatment options, and is a complaint that carries with it a high risk of complications and morbidity. The emergent differential diagnosis of chest pain includes: Acute coronary syndrome, pericarditis, aortic dissection, pulmonary embolism, tension pneumothorax, and esophageal rupture.  other urgent/non-acute considerations include, but are not limited to: chronic angina, aortic stenosis, cardiomyopathy, myocarditis, mitral valve prolapse, pulmonary hypertension, hypertrophic obstructive cardiomyopathy (HOCM), aortic insufficiency, right ventricular hypertrophy, pneumonia, pleuritis, bronchitis, pneumothorax, tumor, gastroesophageal reflux disease (GERD), esophageal spasm, Mallory-Weiss syndrome, peptic ulcer disease, biliary disease, pancreatitis, functional gastrointestinal pain, cervical or thoracic disk disease or arthritis, shoulder arthritis, costochondritis, subacromial bursitis, anxiety or panic attack, herpes zoster, breast disorders, chest wall tumors, thoracic  outlet syndrome, mediastinitis.  Co morbidities that complicate the patient evaluation  varicose veins, preeclampsia  My initial workup includes labs, imaging, EKG, pain control  Additional history obtained from: Nursing notes from this visit.  I ordered, reviewed and interpreted labs which include: BMP, CBC, troponin, delta troponin.  Labs unremarkable  I ordered imaging studies including chest x-ray I independently visualized and interpreted imaging which showed negative I agree with the radiologist interpretation  Cardiac Monitoring:  The patient was maintained on a cardiac monitor.  I personally viewed and interpreted the cardiac monitored which showed an underlying rhythm of: NSR  Afebrile, hemodynamically stable.  47 year old female presents to the ED for evaluation of left-sided anterior chest wall pain.  It is worse with deep inspiration and palpation.  She did have a recent viral illness.  She is PERC negative and I  have low suspicion for PE as the cause of her symptoms.  Initial and delta troponin are negative.  EKG without ischemic changes.  Chest x-ray negative.  Heart score 3 and low suspicion for ACS as the cause of her symptoms as well.  She was reporting significant improvement in her symptoms while in the ED.  Costochondritis likely cause of her symptoms.  She was educated on appropriate use of ibuprofen at home for her symptoms.  She was encouraged to follow-up with her primary care provider in 3 to 4 days for reevaluation.  She was given strict return precautions.  Stable at discharge.  At this time there does not appear to be any evidence of an acute emergency medical condition and the patient appears stable for discharge with appropriate outpatient follow up. Diagnosis was discussed with patient who verbalizes understanding of care plan and is agreeable to discharge. I have discussed return precautions with patient and husband who verbalizes understanding. Patient encouraged to follow-up with their PCP within 4 days. All questions answered.  Patient's case discussed with Dr. Criss Alvine who agrees with plan to discharge with follow-up.   Note: Portions of this report may have been transcribed using voice recognition software. Every effort was made to ensure accuracy; however, inadvertent computerized transcription errors may still be present.        Final Clinical Impression(s) / ED Diagnoses Final diagnoses:  Atypical chest pain    Rx / DC Orders ED Discharge Orders     None         Michelle Piper, Cordelia Poche 02/09/23 2347    Pricilla Loveless, MD 02/16/23 [phone]

## 2023-02-09 NOTE — ED Triage Notes (Signed)
Patient arrived POV from home with complaint of left sided chest pain, non radiating, reproducible with palpation reports pain worse with deep breaths. Pain starting at 1700 today.

## 2023-02-09 NOTE — ED Provider Triage Note (Cosign Needed Addendum)
Emergency Medicine Provider Triage Evaluation Note  Roberta Marshall , a 47 y.o. female  was evaluated in triage.  Pt complains of chest pain.  Started around 5 PM this afternoon.  Located in the left chest feels like stabbing.  Radiates to the left back at times.  Nonexertional and nonreproducible.  Nonpleuritic as well.  Endorses associated shortness of breath. Denies cough congestion or fever.  Review of Systems  Positive: See above Negative: See above  Physical Exam  BP 139/76   Pulse 80   Temp 98.2 F (36.8 C)   Resp 15   Ht  (1.6 m)   Wt 99.8 kg   LMP 02/02/2023   SpO2 100%   BMI 38.97 kg/m  Gen:   Awake, no distress   Resp:  Normal effort  MSK:   Moves extremities without difficulty  Other:    Medical Decision Making  Medically screening exam initiated at 7:40 PM.  Appropriate orders placed.  Roberta Marshall was informed that the remainder of the evaluation will be completed by another provider, this initial triage assessment does not replace that evaluation, and the importance of remaining in the ED until their evaluation is complete.  Work up started      Roberta Marshall, [place] 02/09/23 1942

## 2023-02-09 NOTE — Discharge Instructions (Signed)
Le han atendido hoy por su queja de dolor en el pecho. Su anlisis de laboratorio fue tranquilizador y no Roberta Marshall. Sus imgenes fueron tranquilizadoras y no Roberta Marshall. Sus medicamentos de alta incluyen Tylenol e ibuprofeno alternativos para Roberta Marshall. Puede alternarlos cada 4 horas. Puede tomar hasta 800 mg de ibuprofeno a la vez y hasta 1000 mg de tylenol. Haga un seguimiento con: Su proveedor de atencin primaria para una reevaluacin en 3 a 4 das. Busque atencin mdica inmediata si desarrolla alguno de los siguientes sntomas:  Tiene nuseas o vmitos.  Roberta Marshall o tiene sensacin de desvanecimiento.  Tiene tos con mucosidad de los pulmones (esputo) o expectora sangre al toser.  Le falta el aire. En este momento no parece haber una condicin [employer], sin embargo, siempre existe la posibilidad de que las condiciones [name]. [place] y siga las instrucciones a continuacin.  No tome su medicamento si desarrolla sarpullido con picazn, hinchazn en la boca o los labios o dificultad para respirar; llame al 911 y busque atencin mdica de emergencia inmediata si esto ocurre.  Puede revisar sus pruebas de laboratorio y Roberta Marshall de imgenes en su totalidad en su cuenta MyChart. Analice todos los resultados de forma completa con su proveedor de atencin primaria y otro especialista en su visita de seguimiento.  Nota: Es posible que partes de este texto se hayan transcrito utilizando un software de Network engineer de voz. Se hizo todo lo posible para garantizar la precisin; sin embargo, es posible que an Roberta Marshall errores de transcripcin computarizados involuntarios.  You have been seen today for your complaint of chest pain. Your lab work was reassuring and showed no abnormalities. Your imaging was reassuring and showed no abnormalities. Your discharge medications include Alternate tylenol and ibuprofen for pain. You may alternate these every 4 hours. You may take up to 800  mg of ibuprofen at a time and up to 1000 mg of tylenol. Follow up with: Your primary care provider for reevaluation in 3 to 4 days Please seek immediate medical care if you develop any of the following symptoms: Tiene nuseas o vmitos. Roberta Marshall o tiene sensacin de desvanecimiento. Tiene tos con mucosidad de los pulmones (esputo) o expectora sangre al toser. Le falta el aire. At this time there does not appear to be the presence of an emergent medical condition, however there is always the potential for conditions to change. Please read and follow the below instructions.  Do not take your medicine if  develop an itchy rash, swelling in your mouth or lips, or difficulty breathing; call 911 and seek immediate emergency medical attention if this occurs.  You may review your lab tests and imaging results in their entirety on your MyChart account.  Please discuss all results of fully with your primary care provider and other specialist at your follow-up visit.  Note: Portions of this text may have been transcribed using voice recognition software. Every effort was made to ensure accuracy; however, inadvertent computerized transcription errors may still be present.

## 2023-02-09 NOTE — ED Notes (Signed)
Patient verbalizes understanding of discharge instructions. Opportunity for questioning and answers were provided. Armband removed by staff, pt discharged from ED. Pt taken to ED entrance via wheel chair.
